# Patient Record
Sex: Female | Born: 1985 | Race: Black or African American | Hispanic: No | Marital: Single
Health system: Midwestern US, Community
[De-identification: ages and names within clinical notes are randomized; demographics above are authoritative.]

## PROBLEM LIST (undated history)

## (undated) DIAGNOSIS — F419 Anxiety disorder, unspecified: Secondary | ICD-10-CM

## (undated) DIAGNOSIS — F32A Depression, unspecified: Secondary | ICD-10-CM

## (undated) DIAGNOSIS — G4730 Sleep apnea, unspecified: Secondary | ICD-10-CM

## (undated) DIAGNOSIS — D649 Anemia, unspecified: Secondary | ICD-10-CM

## (undated) DIAGNOSIS — T7840XA Allergy, unspecified, initial encounter: Secondary | ICD-10-CM

## (undated) DIAGNOSIS — G4733 Obstructive sleep apnea (adult) (pediatric): Secondary | ICD-10-CM

## (undated) DIAGNOSIS — J45909 Unspecified asthma, uncomplicated: Secondary | ICD-10-CM

## (undated) DIAGNOSIS — R011 Cardiac murmur, unspecified: Secondary | ICD-10-CM

## (undated) DIAGNOSIS — R87629 Unspecified abnormal cytological findings in specimens from vagina: Secondary | ICD-10-CM

## (undated) DIAGNOSIS — M1990 Unspecified osteoarthritis, unspecified site: Secondary | ICD-10-CM

## (undated) HISTORY — DX: Anemia, unspecified: D64.9

## (undated) HISTORY — DX: Unspecified abnormal cytological findings in specimens from vagina: R87.629

## (undated) HISTORY — DX: Anxiety disorder, unspecified: F41.9

## (undated) HISTORY — DX: Unspecified osteoarthritis, unspecified site: M19.90

## (undated) HISTORY — PX: ADENOIDECTOMY: SUR15

## (undated) HISTORY — DX: Cardiac murmur, unspecified: R01.1

## (undated) HISTORY — PX: TONSILLECTOMY: SUR1361

## (undated) HISTORY — DX: Depression, unspecified: F32.A

## (undated) HISTORY — DX: Sleep apnea, unspecified: G47.30

## (undated) HISTORY — PX: CHOLECYSTECTOMY: SHX55

## (undated) HISTORY — DX: Allergy, unspecified, initial encounter: T78.40XA

## (undated) HISTORY — PX: POLYPECTOMY: SHX149

---

## 2015-12-10 DIAGNOSIS — G43009 Migraine without aura, not intractable, without status migrainosus: Secondary | ICD-10-CM | POA: Insufficient documentation

## 2015-12-10 DIAGNOSIS — K219 Gastro-esophageal reflux disease without esophagitis: Secondary | ICD-10-CM | POA: Insufficient documentation

## 2016-06-14 DIAGNOSIS — L2084 Intrinsic (allergic) eczema: Secondary | ICD-10-CM | POA: Insufficient documentation

## 2016-12-07 DIAGNOSIS — F419 Anxiety disorder, unspecified: Secondary | ICD-10-CM | POA: Insufficient documentation

## 2017-04-04 DIAGNOSIS — Z6841 Body Mass Index (BMI) 40.0 and over, adult: Secondary | ICD-10-CM | POA: Insufficient documentation

## 2018-02-07 DIAGNOSIS — K635 Polyp of colon: Secondary | ICD-10-CM | POA: Insufficient documentation

## 2020-02-04 DIAGNOSIS — Z5941 Food insecurity: Secondary | ICD-10-CM | POA: Insufficient documentation

## 2020-03-10 ENCOUNTER — Inpatient Hospital Stay
Admit: 2020-03-10 | Discharge: 2020-03-10 | Disposition: A | Discharge status: Home / Self Care | Payer: BLUE CROSS/BLUE SHIELD | Attending: Emergency Medicine

## 2020-03-10 ENCOUNTER — Emergency Department: Admit: 2020-03-10 | Payer: BLUE CROSS/BLUE SHIELD

## 2020-03-10 DIAGNOSIS — U071 COVID-19: Secondary | ICD-10-CM

## 2020-03-10 LAB — METABOLIC PANEL, BASIC
Anion gap: 4 mmol/L — ABNORMAL LOW (ref 5–15)
BUN: 6 mg/dl — ABNORMAL LOW (ref 7–25)
CO2: 30 mEq/L (ref 21–32)
Calcium: 8.9 mg/dl (ref 8.5–10.1)
Chloride: 104 mEq/L (ref 98–107)
Creatinine: 0.6 mg/dl (ref 0.6–1.3)
GFR est AA: >60
GFR est non-AA: >60
Glucose: 81 mg/dl (ref 74–106)
Potassium: 4 mEq/L (ref 3.5–5.1)
Sodium: 138 mEq/L (ref 136–145)

## 2020-03-10 LAB — CBC WITH AUTOMATED DIFF
BASOPHILS: 0.2 % (ref 0–3)
EOSINOPHILS: 0 % (ref 0–5)
HCT: 35.9 % — ABNORMAL LOW (ref 37.0–50.0)
HGB: 11.9 gm/dl — ABNORMAL LOW (ref 13.0–17.2)
IMMATURE GRANULOCYTES: 0.2 % (ref 0.0–3.0)
LYMPHOCYTES: 29.9 % (ref 28–48)
MCH: 29.2 pg (ref 25.4–34.6)
MCHC: 33.1 gm/dl (ref 30.0–36.0)
MCV: 88 fL (ref 80.0–98.0)
MONOCYTES: 9.7 % (ref 1–13)
MPV: 10.3 fL — ABNORMAL HIGH (ref 6.0–10.0)
NEUTROPHILS: 60 % (ref 34–64)
NRBC: 0 (ref 0–0)
PLATELET: 257 10*3/uL (ref 140–450)
RBC: 4.08 M/uL (ref 3.60–5.20)
RDW-SD: 45.1 (ref 36.4–46.3)
WBC: 4.3 10*3/uL (ref 4.0–11.0)

## 2020-03-10 LAB — EKG, 12 LEAD, INITIAL
Atrial Rate: 74 {beats}/min
Calculated P Axis: -11 degrees
Calculated R Axis: 39 degrees
Calculated T Axis: 17 degrees
Diagnosis: NORMAL
P-R Interval: 152 ms
Q-T Interval: 400 ms
QRS Duration: 74 ms
QTC Calculation (Bezet): 444 ms
Ventricular Rate: 74 {beats}/min

## 2020-03-10 LAB — TROPONIN I: Troponin-I: <0.015 ng/ml (ref 0.000–0.045)

## 2020-03-10 LAB — POC HCG,URINE
HCG urine, QL: NEGATIVE
Pregnancy Test(Urn): NEGATIVE

## 2020-03-10 LAB — EKG 12-LEAD
Atrial Rate: 74 {beats}/min
Diagnosis: NORMAL
P Axis: -11 degrees
P-R Interval: 152 ms
Q-T Interval: 400 ms
QRS Duration: 74 ms
QTc Calculation (Bazett): 444 ms
R Axis: 39 degrees
T Axis: 17 degrees
Ventricular Rate: 74 {beats}/min

## 2020-03-10 LAB — BASIC METABOLIC PANEL
Anion Gap: 4 mmol/L — ABNORMAL LOW (ref 5–15)
BUN: 6 mg/dl — ABNORMAL LOW (ref 7–25)
CO2: 30 mEq/L (ref 21–32)
Calcium: 8.9 mg/dl (ref 8.5–10.1)
Chloride: 104 mEq/L (ref 98–107)
Creatinine: 0.6 mg/dl (ref 0.6–1.3)
EGFR IF NonAfrican American: >60
GFR African American: >60
Glucose: 81 mg/dl (ref 74–106)
Potassium: 4 mEq/L (ref 3.5–5.1)
Sodium: 138 mEq/L (ref 136–145)

## 2020-03-10 LAB — CBC WITH AUTO DIFFERENTIAL
Basophils %: 0.2 % (ref 0–3)
Eosinophils %: 0 % (ref 0–5)
Hematocrit: 35.9 % — ABNORMAL LOW (ref 37.0–50.0)
Hemoglobin: 11.9 gm/dl — ABNORMAL LOW (ref 13.0–17.2)
Immature Granulocytes: 0.2 % (ref 0.0–3.0)
Lymphocytes %: 29.9 % (ref 28–48)
MCH: 29.2 pg (ref 25.4–34.6)
MCHC: 33.1 gm/dl (ref 30.0–36.0)
MCV: 88 fL (ref 80.0–98.0)
MPV: 10.3 fL — ABNORMAL HIGH (ref 6.0–10.0)
Monocytes %: 9.7 % (ref 1–13)
Neutrophils %: 60 % (ref 34–64)
Nucleated RBCs: 0 (ref 0–0)
Platelets: 257 10*3/uL (ref 140–450)
RBC: 4.08 M/uL (ref 3.60–5.20)
RDW-SD: 45.1 (ref 36.4–46.3)
WBC: 4.3 10*3/uL (ref 4.0–11.0)

## 2020-03-10 LAB — TROPONIN: Troponin I: <0.015 ng/ml (ref 0.000–0.045)

## 2020-03-10 MED ORDER — IPRATROPIUM-ALBUTEROL 2.5 MG-0.5 MG/3 ML NEB SOLUTION
2.5 mg-0.5 mg/3 ml | RESPIRATORY_TRACT | Status: AC
Start: 2020-03-10 — End: 2020-03-10
  Administered 2020-03-10: 16:00:00 via RESPIRATORY_TRACT

## 2020-03-10 MED ORDER — IOPAMIDOL 61 % IV SOLN
61 % | Freq: Once | INTRAVENOUS | Status: AC
Start: 2020-03-10 — End: 2020-03-10
  Administered 2020-03-10: 19:00:00 via INTRAVENOUS

## 2020-03-10 MED ORDER — BENZONATATE 100 MG CAP
100 mg | ORAL_CAPSULE | Freq: Three times a day (TID) | ORAL | 0 refills | Status: AC | PRN
Start: 2020-03-10 — End: 2020-03-17

## 2020-03-10 MED ORDER — ALBUTEROL SULFATE HFA 90 MCG/ACTUATION AEROSOL INHALER
90 mcg/actuation | RESPIRATORY_TRACT | 0 refills | Status: AC | PRN
Start: 2020-03-10 — End: ?

## 2020-03-10 MED FILL — IPRATROPIUM-ALBUTEROL 2.5 MG-0.5 MG/3 ML NEB SOLUTION: 2.5 mg-0.5 mg/3 ml | RESPIRATORY_TRACT | Qty: 3

## 2020-03-10 MED FILL — ISOVUE-300  61 % INTRAVENOUS SOLUTION: 300 mg iodine /mL (61 %) | INTRAVENOUS | Qty: 80

## 2020-03-10 NOTE — ED Notes (Signed)
Pt comes by ems for chest pain and coughing up bloody sputum- dx with covid 2 days ago at [facility]  Ems gave aspiring in placed 318 in rac

## 2020-03-10 NOTE — ED Provider Notes (Signed)
ED Provider Notes by Everardo AllAbcede, Amalia Edgecombe R, PA at 03/10/20 1015                Author: Everardo AllAbcede, Rylin Saez R, PA  Service: EMERGENCY  Author Type: Physician Assistant       Filed: 03/10/20 1538  Date of Service: 03/10/20 1015  Status: Attested           Editor: Everardo AllAbcede, Daralyn Bert R, PA (Physician Assistant)  Cosigner: Posey ProntoStambaugh, Robert E, MD at 03/10/20 1544          Attestation signed by Posey ProntoStambaugh, Robert E, MD at 03/10/20 1544          I interviewed and examined the patient. I discussed with the mid-level provider agree with her evaluation and plan as documented here.I have reviewed and agree  with all pertinent clinical information including history, physical exam, labs, radiographic studies and the plan.  I have also reviewed and agree with the medications, allergies and past medical history sections for this patient.      My examination, patient alert and nontoxic-appearing.  CT angiogram negative for pulmonary embolism.  Patient maintained good pulse oximetry, so I believe safe for discharge and watchful waiting.                                    [name]  Age: 34 y.o.  Sex: female          Date of Birth: 16-Jul-1985  Admit Date: 03/10/2020  PCP: None         MRN: [id number]   CSN: [id number]            Room: ER22/ER22  Time Dictated: 10:15 AM          Attending Physician: Posey ProntoSTAMBAUGH, ROBERT E, MD   Physician Assistant: Asencion IslamAnnalyn Misa Fedorko      Chief Complaint      Chief Complaint       Patient presents with        ?  Positive For Covid-19        ?  Chest Congestion             History of Present Illness     34 y.o. female  with history of asthma who presents to the ED via medic with multiple complaints.  She states "I somehow contracted Covid".  She became symptomatic for Covid last Monday.  [facility] ago.  Patient complaining of constant midsternal  chest pain since Friday.  She states "it just  hurts".  She feels very short of breath.  She has had a productive cough with blood-tinged sputum.  Fevers have resolved.  She is also had a few episodes of nonbloody vomiting and diarrhea daily.       She is not vaccinated for Covid        Review of Systems     Constitutional: No fever or chills   Eyes: No visual symptoms   ENT: No sore throat, runny nose, or other URI symptoms   Respiratory: As noted above   Cardiovascular: As noted above   Gastrointestinal: As noted above   Genitourinary: No dysuria or hematuria   Musculoskeletal: No swelling   Integumentary: No rashes   Neurological: No headaches   Psychiatric: No HI or SI  Past Medical/Surgical History          Past Medical History:        Diagnosis  Date         ?  Asthma          History reviewed. No pertinent surgical history.           Social History          Social History          Socioeconomic History         ?  Marital status:  SINGLE              Spouse name:  Not on file         ?  Number of children:  Not on file     ?  Years of education:  Not on file     ?  Highest education level:  Not on file       Tobacco Use         ?  Smoking status:  Current Every Day Smoker     ?  Smokeless tobacco:  Never Used       Substance and Sexual Activity         ?  Alcohol use:  Yes             Comment: socailly         ?  Drug use:  Not Currently          Social Determinants of Health          Financial Resource Strain:         ?  Difficulty of Paying Living Expenses:        Food Insecurity:         ?  Worried About Programme researcher, broadcasting/film/video in the Last Year:      ?  Barista in the Last Year:        Transportation Needs:         ?  Freight forwarder (Medical):      ?  Lack of Transportation (Non-Medical):        Physical Activity:         ?  Days of Exercise per Week:      ?  Minutes of Exercise per Session:        Stress:         ?  Feeling of Stress :        Social Connections:         ?  Frequency of Communication with Friends and Family:      ?   Frequency of Social Gatherings with Friends and Family:      ?  Attends Religious Services:      ?  Active Member of Clubs or Organizations:      ?  Attends Banker Meetings:         ?  Marital Status:              Family History          Family History       Family history unknown: Yes           Patient states she is not in contact with any family and therefore do not know any history        Home Medications          None  Allergies          Allergies        Allergen  Reactions         ?  Shellfish Derived  Unknown (comments)             Physical Exam        Visit Vitals      BP  (!) 112/58 (BP 1 Location: Right upper arm)     Pulse  69     Temp  100 ??F (37.8 ??C)     Resp  19        SpO2  98%        General appearance: Well developed, well nourished female  sitting up comfortably in bed on her cell phone   Eyes: Conjunctivae clear, non-icteric   ENT: Ears/Nose: Hearing is grossly intact to voice. External examinations of the ears and nose are unremarkable.   Respiratory: Clear to auscultation bilaterally, unlabored breathing, speaking full sentences   Cardiovascular: Regular rate and rhythm   GI: Abdomen is soft, non-tender   Musculoskeletal: Patient able to move all four extremities. No peripheral edema or posterior calf tenderness bilaterally   Skin: Warm and dry without rashes   Neurologic: Alert, oriented. Answers questions appropriately        Impression and Management Plan     This is a 34 y.o.  female with history of asthma, recently diagnosed with Covid who presents to the ED complaining of constant chest pain and shortness of breath.  Acute ischemic coronary disease must be considered first,  and the patient protected against consequences of same, while other etiologies (including infectious, pulmonary, GI and musculoskeletal)  are considered.  Chest pain protocol initiated prior to my evaluation.  Patient also reports blood-tinged sputum.   Obtain chest CTA to evaluate for acute  PE.        Diagnostic Studies     Lab:      Labs Reviewed       CBC WITH AUTOMATED DIFF - Abnormal; Notable for the following components:            Result  Value            HGB  11.9 (*)         HCT  35.9 (*)         MPV  10.3 (*)            All other components within normal limits       METABOLIC PANEL, BASIC - Abnormal; Notable for the following components:            BUN  6 (*)         Anion gap  4 (*)            All other components within normal limits       TROPONIN I       POC HCG,URINE           Pre-hospital EKG: interpreted by [facility], Jonelle Sports, MD shows sinus rhythm, rate 78, no evidence of acute ischemia      EKG: interpreted by [facility], Jonelle Sports, MD shows normal sinus rhythm, rate 74, intervals within normal limits, no evidence of acute ischemia      Imaging:        Based on my interpretation the chest x-ray shows mild vascular congestion      XR CHEST SNGL V      Result Date: 03/10/2020  INDICATION: Hemoptysis, chest pain. Chest pain COMPARISON: none TECHNIQUE: Single view chest.       IMPRESSION: Heart size is normal. Mild vascular congestion. Patchy infiltrates in the left mid to upper lung zone. Follow-up radiograph to document resolution is recommended.       CT CHEST W CON PE PROTOCOL      Result Date: 03/10/2020   HISTORY: chest pain, covid+ COMPARISON: None TECHNIQUE: Initial unenhanced monitoring slices were obtained through the pulmonary arteries for bolus tracking purposes. Then, multidetector CT acquisition was obtained through the pulmonary arteries and the  chest following IV contrast administration. 3D angiographic post-processing was performed including coronal and sagittal MIP reconstructions. All CT exams at this facility use one or more dose reduction techniques including automatic exposure control,  mA/kV adjustment per patient's size, or iterative reconstruction technique. FINDINGS: Quality of exam: Adequate. Pulmonary arteries: Normal in caliber and enhancement. No filling  defects are identified to suggest pulmonary thromboembolism. Aorta: Normal  in caliber with no aneurysm or dissection. Lungs: Upper lobe patchy consolidation. Additional scattered mild peripheral lung infiltrates Mediastinum: No enlarged nodes considered pathologic by size criteria. Osseous:  No discrete lesion. Miscellaneous:  None.       IMPRESSION: 1. No pulmonary artery filling defects to suggest acute pulmonary thromboembolism. 2. Lung infiltrates consistent Covid 19 pneumonia. Recommend follow-up chest radiograph in one month to ensure resolution         ED Course          ED Course as of Mar 10 1536       Mon Mar 10, 2020        1225  WBC: 4.3 [AA]     1225  HGB(!): 11.9 [AA]     1225  HCT(!): 35.9 [AA]     1225  BUN(!): 6 [AA]     1225  Creatinine: 0.6 [AA]     1225  Troponin-I: <0.015 [AA]     1355  HCG urine, QL: negative [AA]     1355  Spoke to [place] in [place] about patient's negative pregnancy test.  They're going to send for her now     [AA]     1434  Patient pressing on her call bell. Updated patient on the plan.  Awaiting CT scan. Patient upset due to the wait     [AA]              ED Course User Index   [AA] Jeriah Skufca R, PA           Patient stable in the ER.  CBC with no leukocytosis and mild anemia.  BMP unremarkable.  EKG and initial troponin unremarkable for ACS. Urine pregnancy negative.  Chest CT unremarkable for PE.  Related to the patient in the hallway while she was leaving  for CT.  Patient upset and "are not talking to you anymore".  Patient given instructions to continue to self quarantine.  Take medication as prescribed.  Discussed symptomatic treatment for pain and fever. Return to the ER if condition worsens or new  symptoms develop.  Patient agreed to the plan and all of her questions were answered        Medications       albuterol-ipratropium (DUO-NEB) 2.5 MG-0.5 MG/3 ML (3 mL Nebulization Given 03/10/20 1137)       iopamidoL (ISOVUE 300) 61 % contrast injection 80 mL (80 mL  IntraVENous Given 03/10/20 1506)          Final Diagnosis  ICD-10-CM  ICD-9-CM          1.  COVID-19 virus infection   U07.1  079.89     2.  Coughing up blood   R04.2  786.30          3.  SOB (shortness of breath)   R06.02  786.05          Disposition     Patient discharged home in stable condition with the instructions stated above. Prescription for albuterol inhaler and Tessalon Perles given.  Work note issued.            The patient was personally evaluated by myself and discussed with [facility], Jonelle Sports, MD who agrees with the above assessment and plan         Asencion Islam, PA-C   March 10, 2020         [facility] medical dictation software was used for portions of this report. Unintended errors may occur.          My signature above authenticates this document and my orders, the final     diagnosis (es), discharge prescription (s), and instructions in the Epic     record.   If you have any questions please contact [phone].       Nursing notes have been reviewed by the physician/ advanced practice     Clinician.

## 2021-10-15 ENCOUNTER — Other Ambulatory Visit: Payer: Self-pay

## 2021-10-15 ENCOUNTER — Emergency Department (HOSPITAL_COMMUNITY)
Admission: EM | Admit: 2021-10-15 | Discharge: 2021-10-15 | Disposition: A | Discharge status: Home / Self Care | Payer: Medicaid Other | Attending: Emergency Medicine | Admitting: Emergency Medicine

## 2021-10-15 ENCOUNTER — Encounter (HOSPITAL_COMMUNITY): Payer: Self-pay

## 2021-10-15 DIAGNOSIS — J45909 Unspecified asthma, uncomplicated: Secondary | ICD-10-CM | POA: Diagnosis not present

## 2021-10-15 DIAGNOSIS — Z20822 Contact with and (suspected) exposure to covid-19: Secondary | ICD-10-CM | POA: Diagnosis not present

## 2021-10-15 DIAGNOSIS — J069 Acute upper respiratory infection, unspecified: Secondary | ICD-10-CM

## 2021-10-15 DIAGNOSIS — J029 Acute pharyngitis, unspecified: Secondary | ICD-10-CM | POA: Diagnosis present

## 2021-10-15 DIAGNOSIS — J209 Acute bronchitis, unspecified: Secondary | ICD-10-CM | POA: Diagnosis not present

## 2021-10-15 DIAGNOSIS — J40 Bronchitis, not specified as acute or chronic: Secondary | ICD-10-CM

## 2021-10-15 HISTORY — DX: Unspecified asthma, uncomplicated: J45.909

## 2021-10-15 HISTORY — DX: Obstructive sleep apnea (adult) (pediatric): G47.33

## 2021-10-15 LAB — RESP PANEL BY RT-PCR (FLU A&B, COVID) ARPGX2
Influenza A by PCR: NEGATIVE
Influenza B by PCR: NEGATIVE
SARS Coronavirus 2 by RT PCR: NEGATIVE

## 2021-10-15 MED ORDER — METHYLPREDNISOLONE 4 MG PO TBPK: ORAL_TABLET | ORAL | 0 refills | Status: DC

## 2021-10-15 NOTE — ED Notes (Signed)
I provided reinforced discharge education based off of discharge instructions. Pt acknowledged and understood my education. Pt had no further questions/concerns for provider/myself.  °

## 2021-10-15 NOTE — ED Provider Notes (Signed)
?[facility]-EMERGENCY DEPT ?Provider Note ? ? ?CSN: [id number] ?Arrival date & time: 10/15/21  0844 ? ?  ? ?History ? ?Chief Complaint  ?Patient presents with  ? Cough  ? Sore Throat  ? Asthma  ? ? ?Tina Malone is a 36 y.o. female who presents emergency department with chief complaint of URI symptoms.  She has a past medical history of asthma with hospitalization without intubation.  She is on multiple asthma controller medications.  She developed cough, wheezing, sore throat, generalized malaise, runny nose beginning 3 days ago along with both of her children.  She denies fevers or chills and denies any active shortness of breath. ? ? ?Cough ?Sore Throat ? ?Asthma ? ? ?  ? ?Home Medications ?Prior to Admission medications   ?Medication Sig Start Date End Date Taking? Authorizing Provider  ?methylPREDNISolone (MEDROL DOSEPAK) 4 MG TBPK tablet Use as directed 10/15/21   Arthor Captain, PA-C  ?   ? ?Allergies    ?Other and Shellfish allergy   ? ?Review of Systems   ?Review of Systems  ?Respiratory:  Positive for cough.   ? ?Physical Exam ?Updated Vital Signs ?BP 133/90 (BP Location: Right Arm)   Pulse 80   Temp 97.7 ?F (36.5 ?C) (Oral)   Resp 18   Ht 4\' 11"  (1.499 m)   Wt 109.8 kg   LMP 10/04/2021 (Approximate)   SpO2 100%   BMI 48.88 kg/m?  ?Physical Exam ?Vitals and nursing note reviewed.  ?Constitutional:   ?   General: She is not in acute distress. ?   Appearance: She is well-developed. She is not diaphoretic.  ?HENT:  ?   Head: Normocephalic and atraumatic.  ?   Right Ear: Tympanic membrane and external ear normal.  ?   Left Ear: Tympanic membrane and external ear normal.  ?   Nose: Nose normal.  ?   Mouth/Throat:  ?   Mouth: Mucous membranes are moist.  ?   Pharynx: Uvula midline. No posterior oropharyngeal erythema.  ?   Tonsils: No tonsillar exudate or tonsillar abscesses.  ?Eyes:  ?   General: No scleral icterus. ?   Conjunctiva/sclera: Conjunctivae normal.  ?Cardiovascular:  ?    Rate and Rhythm: Normal rate and regular rhythm.  ?   Heart sounds: Normal heart sounds. No murmur heard. ?  No friction rub. No gallop.  ?Pulmonary:  ?   Effort: Pulmonary effort is normal. No respiratory distress.  ?   Breath sounds: Normal breath sounds.  ?Abdominal:  ?   General: Bowel sounds are normal. There is no distension.  ?   Palpations: Abdomen is soft. There is no mass.  ?   Tenderness: There is no abdominal tenderness. There is no guarding.  ?Musculoskeletal:  ?   Cervical back: Normal range of motion.  ?Skin: ?   General: Skin is warm and dry.  ?Neurological:  ?   Mental Status: She is alert and oriented to person, place, and time.  ?Psychiatric:     ?   Behavior: Behavior normal.  ? ? ?ED Results / Procedures / Treatments   ?Labs ?(all labs ordered are listed, but only abnormal results are displayed) ?Labs Reviewed  ?RESP PANEL BY RT-PCR (FLU A&B, COVID) [id number]  ? ? ?EKG ?None ? ?Radiology ?No results found. ? ?Procedures ?Procedures  ? ? ?Medications Ordered in ED ?Medications - No data to display ? ?ED Course/ Medical Decision Making/ A&P ?  ?                        ?  Medical Decision Making ? Patients symptoms are consistent with URI, likely viral etiology. Discussed that antibiotics are not indicated for viral infections. Pt will be discharged with symptomatic treatment.  Verbalizes understanding and is agreeable with plan. Pt is hemodynamically stable & in NAD prior to dc. ? ?D/c with steroids. ? ?Problems Addressed: ?Bronchitis with acute wheezing: complicated acute illness or injury ?Upper respiratory tract infection, unspecified type: complicated acute illness or injury ? ?Amount and/or Complexity of Data Reviewed ?Labs: ordered. ?Radiology: ordered. ? ?Risk ?Prescription drug management. ? ? ? ? ? ?  ? ? ? ? ?Final Clinical Impression(s) / ED Diagnoses ?Final diagnoses:  ?Upper respiratory tract infection, unspecified type  ?Bronchitis with acute wheezing  ? ? ?Rx / DC Orders ?ED Discharge  Orders   ? ?      Ordered  ?  methylPREDNISolone (MEDROL DOSEPAK) 4 MG TBPK tablet  Status:  Discontinued       ? 10/15/21 0943  ?  methylPREDNISolone (MEDROL DOSEPAK) 4 MG TBPK tablet       ? 10/15/21 0957  ? ?  ?  ? ?  ? ? ?  ?Arthor Captain, PA-C ?10/15/21 1321 ? ?  ?Lorre Nick, MD ?10/16/21 [phone] ? ?

## 2021-10-15 NOTE — Discharge Instructions (Signed)
Get help right away if: You have shortness of breath that gets worse. You have severe or persistent: Headache. Ear pain. Sinus pain. Chest pain. You have chronic lung disease along with any of the following: Making high-pitched whistling sounds when you breathe, most often when you breathe out (wheezing). Prolonged cough (more than 14 days). Coughing up blood. A change in your usual mucus. You have a stiff neck. You have changes in your: Vision. Hearing. Thinking. Mood. 

## 2021-10-15 NOTE — ED Triage Notes (Signed)
Patient c/o cough, sore throat and asthma x 3 days ?

## 2021-11-17 ENCOUNTER — Ambulatory Visit
Admission: EM | Admit: 2021-11-17 | Discharge: 2021-11-17 | Disposition: A | Discharge status: Home / Self Care | Payer: Medicaid Other | Attending: Urgent Care | Admitting: Urgent Care

## 2021-11-17 ENCOUNTER — Ambulatory Visit (HOSPITAL_BASED_OUTPATIENT_CLINIC_OR_DEPARTMENT_OTHER)
Admission: RE | Admit: 2021-11-17 | Discharge: 2021-11-17 | Disposition: A | Discharge status: Home / Self Care | Payer: Medicaid Other | Source: Ambulatory Visit | Attending: Urgent Care | Admitting: Urgent Care

## 2021-11-17 ENCOUNTER — Telehealth: Payer: Self-pay

## 2021-11-17 DIAGNOSIS — M5416 Radiculopathy, lumbar region: Secondary | ICD-10-CM

## 2021-11-17 DIAGNOSIS — M5136 Other intervertebral disc degeneration, lumbar region: Secondary | ICD-10-CM | POA: Diagnosis not present

## 2021-11-17 DIAGNOSIS — M545 Low back pain, unspecified: Secondary | ICD-10-CM | POA: Insufficient documentation

## 2021-11-17 MED ORDER — TIZANIDINE HCL 4 MG PO TABS: 4.0000 mg | ORAL_TABLET | Freq: Every day | ORAL | 0 refills | Status: AC

## 2021-11-17 MED ORDER — PREDNISONE 50 MG PO TABS: 50.0000 mg | ORAL_TABLET | Freq: Every day | ORAL | 0 refills | Status: DC

## 2021-11-17 NOTE — Telephone Encounter (Signed)
Patient verification complete (name and date of birth).   Patient re-educated on xray results. All questions answered from patient.

## 2021-11-17 NOTE — ED Provider Notes (Signed)
[facility]   MRN: [id number] DOB: 07-30-1985  Subjective:   Tina Malone is a 36 y.o. female presenting for 1 week history of persistent right-sided low back pain that radiates into the buttock, back of the leg all the way down to her foot.  Has had some tingling and uncomfortable sensation as well.  No changes to bowel or urinary habits.  No fall, trauma, weakness, saddle paresthesia, hematuria.  Has been using Tylenol, Motrin, NSAIDs.  No current facility-administered medications for this encounter.  Current Outpatient Medications:    methylPREDNISolone (MEDROL DOSEPAK) 4 MG TBPK tablet, Use as directed, Disp: 21 tablet, Rfl: 0   Allergies  Allergen Reactions   Other     Oak trees, dogs, and cats   Shellfish Allergy     Past Medical History:  Diagnosis Date   Asthma    OSA (obstructive sleep apnea)      Past Surgical History:  Procedure Laterality Date   ADENOIDECTOMY     CHOLECYSTECTOMY     POLYPECTOMY     TONSILLECTOMY      History reviewed. No pertinent family history.  Social History   Tobacco Use   Smoking status: Some Days    Types: Cigars, Cigarettes   Smokeless tobacco: Never  Vaping Use   Vaping Use: Never used  Substance Use Topics   Alcohol use: Yes   Drug use: Never    ROS   Objective:   Vitals: BP 133/87 (BP Location: Right Arm)   Pulse 67   Temp 97.9 F (36.6 C) (Oral)   Resp 20   LMP 10/24/2021   SpO2 98%   Physical Exam Constitutional:      General: She is not in acute distress.    Appearance: Normal appearance. She is well-developed. She is not ill-appearing, toxic-appearing or diaphoretic.  HENT:     Head: Normocephalic and atraumatic.     Nose: Nose normal.     Mouth/Throat:     Mouth: Mucous membranes are moist.  Eyes:     General: No scleral icterus.       Right eye: No discharge.        Left eye: No discharge.     Extraocular Movements: Extraocular movements intact.  Cardiovascular:      Rate and Rhythm: Normal rate.  Pulmonary:     Effort: Pulmonary effort is normal.  Musculoskeletal:     Lumbar back: Tenderness (over area outlined) present. No swelling, edema, deformity, signs of trauma, lacerations, spasms or bony tenderness. Normal range of motion. Positive right straight leg raise test. Negative left straight leg raise test. No scoliosis.       Back:  Skin:    General: Skin is warm and dry.  Neurological:     General: No focal deficit present.     Mental Status: She is alert and oriented to person, place, and time.     Motor: No weakness.     Coordination: Coordination normal.     Gait: Gait normal.     Deep Tendon Reflexes: Reflexes normal.  Psychiatric:        Mood and Affect: Mood normal.        Behavior: Behavior normal.        Thought Content: Thought content normal.        Judgment: Judgment normal.   Assessment and Plan :   PDMP not reviewed this encounter.  1. Lumbar radiculopathy    Suspect lumbar radiculopathy and as she  has failed NSAID treatment will be using an oral prednisone course with a muscle relaxant.  Patient would like to have an x-ray done and I will pursue this as an outpatient.  Recommended follow-up with the neuro spine specialist should her symptoms persist.  Counseled patient on potential for adverse effects with medications prescribed/recommended today, ER and return-to-clinic precautions discussed, patient verbalized understanding.  We will follow-up with the radiology over read and any changes to treatment plan.   Wallis Bamberg, PA-C 11/17/21 1244

## 2021-11-17 NOTE — ED Triage Notes (Signed)
Pt c/o back pain that radiates down her right leg to her foot. Started: over a week ago Home interventions: tylenol, bayer, motrin

## 2021-12-03 ENCOUNTER — Encounter: Payer: Self-pay | Admitting: Obstetrics and Gynecology

## 2021-12-03 ENCOUNTER — Encounter: Payer: Medicaid Other | Admitting: Obstetrics and Gynecology

## 2022-01-11 ENCOUNTER — Encounter: Payer: Medicaid Other | Admitting: Obstetrics & Gynecology

## 2022-04-27 ENCOUNTER — Encounter (HOSPITAL_BASED_OUTPATIENT_CLINIC_OR_DEPARTMENT_OTHER): Payer: Self-pay

## 2022-04-27 ENCOUNTER — Other Ambulatory Visit: Payer: Self-pay

## 2022-04-27 ENCOUNTER — Emergency Department (HOSPITAL_BASED_OUTPATIENT_CLINIC_OR_DEPARTMENT_OTHER)
Admission: EM | Admit: 2022-04-27 | Discharge: 2022-04-27 | Disposition: A | Discharge status: Home / Self Care | Payer: Medicaid Other | Attending: Emergency Medicine | Admitting: Emergency Medicine

## 2022-04-27 DIAGNOSIS — J45909 Unspecified asthma, uncomplicated: Secondary | ICD-10-CM | POA: Diagnosis not present

## 2022-04-27 DIAGNOSIS — B359 Dermatophytosis, unspecified: Secondary | ICD-10-CM | POA: Diagnosis not present

## 2022-04-27 DIAGNOSIS — X58XXXA Exposure to other specified factors, initial encounter: Secondary | ICD-10-CM | POA: Diagnosis not present

## 2022-04-27 DIAGNOSIS — L089 Local infection of the skin and subcutaneous tissue, unspecified: Secondary | ICD-10-CM

## 2022-04-27 DIAGNOSIS — S90822A Blister (nonthermal), left foot, initial encounter: Secondary | ICD-10-CM | POA: Diagnosis not present

## 2022-04-27 DIAGNOSIS — L301 Dyshidrosis [pompholyx]: Secondary | ICD-10-CM | POA: Insufficient documentation

## 2022-04-27 DIAGNOSIS — S90821A Blister (nonthermal), right foot, initial encounter: Secondary | ICD-10-CM | POA: Diagnosis present

## 2022-04-27 MED ORDER — CEPHALEXIN 500 MG PO CAPS: ORAL_CAPSULE | ORAL | 0 refills | Status: AC

## 2022-04-27 MED ORDER — KETOCONAZOLE 2 % EX CREA: 1.0000 | TOPICAL_CREAM | Freq: Every day | CUTANEOUS | 0 refills | Status: AC

## 2022-04-27 MED ORDER — MUPIROCIN 2 % EX OINT: 1.0000 | TOPICAL_OINTMENT | Freq: Every day | CUTANEOUS | 0 refills | Status: AC

## 2022-04-27 NOTE — ED Triage Notes (Signed)
Pt states she has blisters on both feet. States blisters have been on feet for over a week and has not treated them. Also states she has on right hand.

## 2022-04-27 NOTE — Discharge Instructions (Signed)
Contact a health care provider if: You have symptoms that do not go away. You have signs of infection, such as: Crusting, pus, or a bad smell. More redness, swelling, or pain. Increased warmth in the affected area. Get help right away if: Your skin gets streaking redness with associated pain.

## 2022-04-27 NOTE — ED Provider Notes (Signed)
[facility] EMERGENCY DEPT Provider Note   CSN: [id number] Arrival date & time: 04/27/22  1254     History  No chief complaint on file.   Tina Malone is a 36 y.o. female with a long standing history of allergies, generalized eczema and and asthma.  Patient states that since she got pregnant 7 years ago he has been getting itching, blisters on her feet.  They are both itchy and painful.  She is also had a blister in between her right fourth and fifth digits which exploded a week ago.  Since that time she has had increasing pain in the area.  She is concerned she may have a secondary infection.  HPI     Home Medications Prior to Admission medications   Medication Sig Start Date End Date Taking? Authorizing Provider  methylPREDNISolone (MEDROL DOSEPAK) 4 MG TBPK tablet Use as directed 10/15/21   Arthor Captain, PA-C  predniSONE (DELTASONE) 50 MG tablet Take 1 tablet (50 mg total) by mouth daily with breakfast. 11/17/21   Wallis Bamberg, PA-C  tiZANidine (ZANAFLEX) 4 MG tablet Take 1 tablet (4 mg total) by mouth at bedtime. 11/17/21   Wallis Bamberg, PA-C      Allergies    Other and Shellfish allergy    Review of Systems   Review of Systems  Physical Exam Updated Vital Signs BP 128/82 (BP Location: Right Arm)   Pulse 65   Temp 98.4 F (36.9 C) (Oral)   Resp 20   Ht 4\' 11"  (1.499 m)   Wt 105.2 kg   SpO2 99%   BMI 46.86 kg/m  Physical Exam Vitals and nursing note reviewed.  Constitutional:      General: She is not in acute distress.    Appearance: She is well-developed. She is not diaphoretic.  HENT:     Head: Normocephalic and atraumatic.     Right Ear: External ear normal.     Left Ear: External ear normal.     Nose: Nose normal.     Mouth/Throat:     Mouth: Mucous membranes are moist.  Eyes:     General: No scleral icterus.    Conjunctiva/sclera: Conjunctivae normal.  Cardiovascular:     Rate and Rhythm: Normal rate and regular rhythm.     Heart  sounds: Normal heart sounds. No murmur heard.    No friction rub. No gallop.  Pulmonary:     Effort: Pulmonary effort is normal. No respiratory distress.     Breath sounds: Normal breath sounds.  Abdominal:     General: Bowel sounds are normal. There is no distension.     Palpations: Abdomen is soft. There is no mass.     Tenderness: There is no abdominal tenderness. There is no guarding.  Musculoskeletal:     Cervical back: Normal range of motion.  Skin:    General: Skin is warm and dry.     Comments: Dry skin over bilateral feet, macerated tissue between the fourth and fifth digits on the right side there is a surrounding tissue with peeling and scaling.  Diffuse small blisters under the thick callus.  Neurological:     Mental Status: She is alert and oriented to person, place, and time.  Psychiatric:        Behavior: Behavior normal.     ED Results / Procedures / Treatments   Labs (all labs ordered are listed, but only abnormal results are displayed) Labs Reviewed - No data to display  EKG None  Radiology No results found.  Procedures Procedures    Medications Ordered in ED Medications - No data to display  ED Course/ Medical Decision Making/ A&P                           Medical Decision Making Patient with dyshidrotic dermatitis and blistering under the calluses of her feet.  She appears to have a secondary fungal and bacterial infection.  We will treat with Keflex, ketoconazole, and Bactroban ointment.  She was given referrals to dermatology, allergy and asthma, podiatry.  Patient is new to the area.  She is advised to follow-up with her PCP.  No evidence of cellulitis.  Risk Prescription drug management.     ICD-10-CM   1. Dyshidrotic foot dermatitis  L30.1     2. Tinea  B35.9     3. Skin infection  L08.9             Final Clinical Impression(s) / ED Diagnoses Final diagnoses:  Dyshidrotic foot dermatitis  Tinea  Skin infection    Rx / DC  Orders ED Discharge Orders     None         Margarita Mail, PA-C 04/27/22 1749    Ezequiel Essex, MD 04/27/22 1955

## 2022-05-06 ENCOUNTER — Ambulatory Visit: Payer: Medicaid Other | Admitting: Podiatry

## 2022-05-06 DIAGNOSIS — R238 Other skin changes: Secondary | ICD-10-CM | POA: Diagnosis not present

## 2022-05-06 MED ORDER — DOXYCYCLINE HYCLATE 100 MG PO TABS: 100.0000 mg | ORAL_TABLET | Freq: Two times a day (BID) | ORAL | 0 refills | Status: AC

## 2022-05-06 MED ORDER — TERBINAFINE HCL 250 MG PO TABS: 250.0000 mg | ORAL_TABLET | Freq: Every day | ORAL | 0 refills | Status: AC

## 2022-05-06 MED ORDER — METHYLPREDNISOLONE 4 MG PO TBPK: ORAL_TABLET | ORAL | 0 refills | Status: DC

## 2022-05-06 MED ORDER — PREGABALIN 75 MG PO CAPS: 75.0000 mg | ORAL_CAPSULE | Freq: Two times a day (BID) | ORAL | 0 refills | Status: AC

## 2022-05-06 NOTE — Progress Notes (Signed)
Subjective:  Patient ID: Tina Malone, female    DOB: 01-22-1986,  MRN: [id number]  Chief Complaint  Patient presents with   blisters     Pt stated that she gets blisters     36 y.o. female presents with the above complaint.  Patient presents with complaint of bilateral superficial blister small across both foot.  Patient states it came out of nowhere and has progressive gotten worse.  She states that they come and go as it pleases to get healed up but wanted to get it evaluated she has not seen anyone else prior to seeing me.  She is currently not taking any medication by mouth.  She went to her dermatologist who gave her a topical cream which did not help much.   Review of Systems: Negative except as noted in the HPI. Denies N/V/F/Ch.  Past Medical History:  Diagnosis Date   Asthma    OSA (obstructive sleep apnea)     Current Outpatient Medications:    doxycycline (VIBRA-TABS) 100 MG tablet, Take 1 tablet (100 mg total) by mouth 2 (two) times daily., Disp: 28 tablet, Rfl: 0   methylPREDNISolone (MEDROL DOSEPAK) 4 MG TBPK tablet, Take as directed, Disp: 21 each, Rfl: 0   pregabalin (LYRICA) 75 MG capsule, Take 1 capsule (75 mg total) by mouth 2 (two) times daily., Disp: 60 capsule, Rfl: 0   terbinafine (LAMISIL) 250 MG tablet, Take 1 tablet (250 mg total) by mouth daily., Disp: 30 tablet, Rfl: 0   cephALEXin (KEFLEX) 500 MG capsule, 2 caps po bid x 7 days, Disp: 28 capsule, Rfl: 0   ketoconazole (NIZORAL) 2 % cream, Apply 1 Application topically daily., Disp: 30 g, Rfl: 0   methylPREDNISolone (MEDROL DOSEPAK) 4 MG TBPK tablet, Use as directed, Disp: 21 tablet, Rfl: 0   mupirocin ointment (BACTROBAN) 2 %, Apply 1 Application topically daily., Disp: 22 g, Rfl: 0   predniSONE (DELTASONE) 50 MG tablet, Take 1 tablet (50 mg total) by mouth daily with breakfast., Disp: 5 tablet, Rfl: 0   tiZANidine (ZANAFLEX) 4 MG tablet, Take 1 tablet (4 mg total) by mouth at bedtime., Disp: 30 tablet,  Rfl: 0  Social History   Tobacco Use  Smoking Status Some Days   Types: Cigars, Cigarettes  Smokeless Tobacco Never    Allergies  Allergen Reactions   Other     Oak trees, dogs, and cats   Shellfish Allergy    Objective:  There were no vitals filed for this visit. There is no height or weight on file to calculate BMI. Constitutional Well developed. Well nourished.  Vascular Dorsalis pedis pulses palpable bilaterally. Posterior tibial pulses palpable bilaterally. Capillary refill normal to all digits.  No cyanosis or clubbing noted. Pedal hair growth normal.  Neurologic Normal speech. Oriented to person, place, and time. Epicritic sensation to light touch grossly present bilaterally.  Dermatologic Bilateral superficial skin blisters noted no signs of infection noted no purulent drainage noted.  Serosanguineous drainage was drained.  Appears to be more like a dermatological condition.  Orthopedic: Normal joint ROM without pain or crepitus bilaterally. No visible deformities. No bony tenderness.   Radiographs: None Assessment:   1. Blisters of multiple sites    Plan:  Patient was evaluated and treated and all questions answered.  Bilateral foot blister -All questions and concerns were discussed with the patient in extensive detail. -At this time I am unable to appreciate the etiology of the blister formation she is not swollen or have lymphedema  therefore I do not think this is due to venous flow.  This may be more infectious or fungal in nature therefore I will place her on doxycycline and Lamisil.  We will also place on a steroid pack as well.  I discussed the reasoning behind all of this treatment options she states understanding.  If there is no improvement we will discuss biopsy  No follow-ups on file.  Bilateral foot blister Lamisil for 30 days doxycycline for 14 days.  Medrol Dosepak.

## 2022-06-02 ENCOUNTER — Ambulatory Visit: Payer: Medicaid Other | Admitting: Podiatry

## 2023-01-26 ENCOUNTER — Other Ambulatory Visit: Payer: Self-pay

## 2023-01-26 ENCOUNTER — Emergency Department (HOSPITAL_BASED_OUTPATIENT_CLINIC_OR_DEPARTMENT_OTHER)
Admission: EM | Admit: 2023-01-26 | Discharge: 2023-01-26 | Disposition: A | Discharge status: Home / Self Care | Payer: Medicaid Other | Attending: Emergency Medicine | Admitting: Emergency Medicine

## 2023-01-26 ENCOUNTER — Encounter (HOSPITAL_BASED_OUTPATIENT_CLINIC_OR_DEPARTMENT_OTHER): Payer: Self-pay | Admitting: Emergency Medicine

## 2023-01-26 DIAGNOSIS — M545 Low back pain, unspecified: Secondary | ICD-10-CM | POA: Diagnosis present

## 2023-01-26 DIAGNOSIS — M5441 Lumbago with sciatica, right side: Secondary | ICD-10-CM | POA: Diagnosis not present

## 2023-01-26 DIAGNOSIS — J45909 Unspecified asthma, uncomplicated: Secondary | ICD-10-CM | POA: Diagnosis not present

## 2023-01-26 MED ORDER — KETOROLAC TROMETHAMINE 30 MG/ML IJ SOLN
30.0000 mg | Freq: Once | INTRAMUSCULAR | Status: AC
  Administered 2023-01-26: 30 mg via INTRAMUSCULAR
  Filled 2023-01-26: qty 1

## 2023-01-26 MED ORDER — PREDNISONE 10 MG PO TABS: 40.0000 mg | ORAL_TABLET | Freq: Every day | ORAL | 0 refills | Status: AC

## 2023-01-26 MED ORDER — CYCLOBENZAPRINE HCL 10 MG PO TABS: 10.0000 mg | ORAL_TABLET | Freq: Two times a day (BID) | ORAL | 0 refills | Status: AC | PRN

## 2023-01-26 MED ORDER — LIDOCAINE 5 % EX PTCH
1.0000 | MEDICATED_PATCH | CUTANEOUS | Status: DC
  Administered 2023-01-26: 1 via TRANSDERMAL
  Filled 2023-01-26: qty 1

## 2023-01-26 MED ORDER — LIDOCAINE 5 % EX PTCH: 1.0000 | MEDICATED_PATCH | CUTANEOUS | 0 refills | Status: AC

## 2023-01-26 NOTE — ED Provider Notes (Signed)
[place] EMERGENCY DEPARTMENT AT [facility] Provider Note   CSN: [id number] Arrival date & time: 01/26/23  1226     History  Chief Complaint  Patient presents with   Back Pain    Tina Malone is a 37 y.o. female.   Back Pain   37 year old female presents emergency department with complaints of right low back pain with radiation down her right leg to her foot for the past 2 days.  Patient reports history of "bulging disc" as well as intermittent back pain as well as right-sided sciatica and states this feels similar.  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  She states that symptoms exacerbated when she was moving objects in the kitchen 2 days ago.  States has been taking at home Motrin, Excedrin as well as using heat/cooling topical applications which have helped some.  Presents emergency department for further assessment.  Denies fever, saddle anesthesia, bowel/bladder dysfunction, weakness/sensory deficits different from baseline, history of IV drug use, prolonged corticosteroid use/known immunosuppression, known malignancy.  Reports some baseline decree sensation along anterior lower leg from about mid shin to foot that has been present for the past "several years".  Denies any abdominal pain, nausea, vomiting, urinary symptoms, change in bowel habits.  States she was on both prednisone as well as muscle relaxer in May of last year due to similar exacerbation of which helped her symptoms tremendously.  Past medical history significant for asthma, OSA  Home Medications Prior to Admission medications   Medication Sig Start Date End Date Taking? Authorizing Provider  cyclobenzaprine (FLEXERIL) 10 MG tablet Take 1 tablet (10 mg total) by mouth 2 (two) times daily as needed for muscle spasms. 01/26/23  Yes Sherian Maroon A, PA  lidocaine (LIDODERM) 5 % Place 1 patch onto the skin daily. Remove & Discard patch within 12 hours or as  directed by MD 01/26/23  Yes Sherian Maroon A, PA  predniSONE (DELTASONE) 10 MG tablet Take 4 tablets (40 mg total) by mouth daily for 6 days. 01/26/23 02/01/23 Yes Sherian Maroon A, PA  cephALEXin (KEFLEX) 500 MG capsule 2 caps po bid x 7 days 04/27/22   Arthor Captain, PA-C  doxycycline (VIBRA-TABS) 100 MG tablet Take 1 tablet (100 mg total) by mouth 2 (two) times daily. 05/06/22   Candelaria Stagers, DPM  ketoconazole (NIZORAL) 2 % cream Apply 1 Application topically daily. 04/27/22   Arthor Captain, PA-C  mupirocin ointment (BACTROBAN) 2 % Apply 1 Application topically daily. 04/27/22   Harris, Cammy Copa, PA-C  pregabalin (LYRICA) 75 MG capsule Take 1 capsule (75 mg total) by mouth 2 (two) times daily. 05/06/22   Candelaria Stagers, DPM  terbinafine (LAMISIL) 250 MG tablet Take 1 tablet (250 mg total) by mouth daily. 05/06/22   Candelaria Stagers, DPM  tiZANidine (ZANAFLEX) 4 MG tablet Take 1 tablet (4 mg total) by mouth at bedtime. 11/17/21   Wallis Bamberg, PA-C      Allergies    Other and Shellfish allergy    Review of Systems   Review of Systems  Musculoskeletal:  Positive for back pain.  All other systems reviewed and are negative.   Physical Exam Updated Vital Signs BP 131/79 (BP Location: Right Arm)   Pulse 63   Temp 98.1 F (36.7 C)   Resp 18   Ht 4\' 11"  (1.499 m)   Wt 105.2 kg   LMP 01/23/2023 (Approximate)   SpO2 100%   BMI  46.84 kg/m  Physical Exam Vitals and nursing note reviewed.  Constitutional:      General: She is not in acute distress.    Appearance: She is well-developed.  HENT:     Head: Normocephalic and atraumatic.  Eyes:     Conjunctiva/sclera: Conjunctivae normal.  Cardiovascular:     Rate and Rhythm: Normal rate and regular rhythm.     Heart sounds: No murmur heard. Pulmonary:     Effort: Pulmonary effort is normal. No respiratory distress.     Breath sounds: Normal breath sounds.  Abdominal:     Palpations: Abdomen is soft.     Tenderness: There is no  abdominal tenderness.  Musculoskeletal:        General: No swelling.     Cervical back: Neck supple.     Comments: No midline tenderness of cervical, thoracic, lumbar spine with no obvious step-off or deformity noted.  Paraspinal tenderness noted in the right lumbar region.  Muscular strength 5 out of 5 for bilateral ankle dorsi/plantarflexion, knee flexion/extension, hip extension/flexion.  Patellar reflexes symmetric bilaterally.  Pedal pulses 2+ bilaterally.  Straight leg raise positive on the right.  Skin:    General: Skin is warm and dry.     Capillary Refill: Capillary refill takes less than 2 seconds.  Neurological:     Mental Status: She is alert.  Psychiatric:        Mood and Affect: Mood normal.     ED Results / Procedures / Treatments   Labs (all labs ordered are listed, but only abnormal results are displayed) Labs Reviewed - No data to display  EKG None  Radiology No results found.  Procedures Procedures    Medications Ordered in ED Medications  lidocaine (LIDODERM) 5 % 1 patch (1 patch Transdermal Patch Applied 01/26/23 1405)  ketorolac (TORADOL) 30 MG/ML injection 30 mg (30 mg Intramuscular Given 01/26/23 1406)    ED Course/ Medical Decision Making/ A&P                                 Medical Decision Making Risk Prescription drug management.   This patient presents to the ED for concern of back pain, this involves an extensive number of treatment options, and is a complaint that carries with it a high risk of complications and morbidity.  The differential diagnosis includes fracture, strain/sprain, dislocation, ligamentous/tendinous injury, neurovascular compromise, AAA, aortic dissection, osteomyelitis, discitis, bulging disc, cauda equina, spinal epidural abscess   Co morbidities that complicate the patient evaluation  See HPI   Additional history obtained:  Additional history obtained from EMR External records from outside source obtained and  reviewed including hospital records.  Lumbar x-ray performed in 5/23   Lab Tests:  N/a   Imaging Studies ordered:  N/a   Cardiac Monitoring: / EKG:  The patient was maintained on a cardiac monitor.  I personally viewed and interpreted the cardiac monitored which showed an underlying rhythm of: Sinus rhythm   Consultations Obtained:  N/a   Problem List / ED Course / Critical interventions / Medication management  Right low back pain with sciatica I ordered medication including Toradol, Lidoderm   Reevaluation of the patient after these medicines showed that the patient improved I have reviewed the patients home medicines and have made adjustments as needed   Social Determinants of Health:  Denies tobacco, illicit drug use   Test / Admission - Considered:  Right low  back pain with sciatica Vitals signs  within normal range and stable throughout visit. 37 year old female presents emergency department complaints of right low back pain with sciatica after lifting heavy objects in the kitchen repetitively.  On exam, patient with evidence of right paraspinal tenderness of the lumbar region with straight leg raise positive on the right.  No red flag signs for back pain elicited on HPI or appreciable on PE.  Low suspicion for cauda equina/spinal epidural abscess or other spinal cord impingement.  Patient without evidence of pulse deficits, significant hypertension so low suspicion for aortic dissection.  Patient without urinary symptoms, CVA tenderness to low suspicion for pyelonephritis/nephrolithiasis.  I suspect symptoms are likely secondary to right-sided sciatica with muscular strain and right paraspinal lumbar region.  Patient reported significant improvement with administration of prednisone as well as muscle laxer in the past and will begin similar treatment course as well as recommend rehabilitation exercises and follow-up with spinal specialist in the outpatient setting.   Treatment plan discussed at length with patient and she nausea nursing was agreeable to said plan.  Patient well-appearing, febrile no acute distress. Worrisome signs and symptoms were discussed with the patient, and the patient acknowledged understanding to return to the ED if noticed. Patient was stable upon discharge.          Final Clinical Impression(s) / ED Diagnoses Final diagnoses:  Acute right-sided low back pain with right-sided sciatica    Rx / DC Orders ED Discharge Orders          Ordered    predniSONE (DELTASONE) 10 MG tablet  Daily        01/26/23 1359    cyclobenzaprine (FLEXERIL) 10 MG tablet  2 times daily PRN        01/26/23 1359    lidocaine (LIDODERM) 5 %  Every 24 hours        01/26/23 1414              Peter Garter, [place] 01/26/23 1417    Laurence Spates, MD 01/27/23 [phone]

## 2023-01-26 NOTE — ED Triage Notes (Signed)
Pt via pov from home with back pain x 2 days. Pt has hx of sciatica and bulging disc. Pt alert & oriented, nad noted.

## 2023-01-26 NOTE — Discharge Instructions (Addendum)
As discussed, visit emergency department today overall consistent with right-sided sciatica.  See information attached to discharge papers regarding rehab exercises.  Will send in steroids for treatment of your back pain as well as muscle laxer.  Note the muscle laxer can cause drowsiness so please do not drive while taking said medication.  Recommend follow-up with primary care for reassessment of your symptoms.  I will also attach information regarding spinal specialist to follow-up with given your recurring back pain as they can do more directed interventions than oral medication.  Please do not hesitate to return to emergency department if the worrisome signs and symptoms we discussed become apparent.

## 2023-02-02 ENCOUNTER — Ambulatory Visit: Payer: Medicaid Other | Admitting: Podiatry

## 2023-02-02 DIAGNOSIS — L089 Local infection of the skin and subcutaneous tissue, unspecified: Secondary | ICD-10-CM

## 2023-02-02 DIAGNOSIS — B999 Unspecified infectious disease: Secondary | ICD-10-CM

## 2023-02-02 MED ORDER — TERBINAFINE HCL 250 MG PO TABS: 250.0000 mg | ORAL_TABLET | Freq: Every day | ORAL | 0 refills | Status: AC

## 2023-02-02 MED ORDER — DOXYCYCLINE HYCLATE 100 MG PO TABS: 100.0000 mg | ORAL_TABLET | Freq: Two times a day (BID) | ORAL | 0 refills | Status: AC

## 2023-02-02 MED ORDER — CLOTRIMAZOLE-BETAMETHASONE 1-0.05 % EX CREA: 1.0000 | TOPICAL_CREAM | Freq: Every day | CUTANEOUS | 0 refills | Status: AC

## 2023-02-02 NOTE — Progress Notes (Signed)
Subjective:  Patient ID: Tina Malone, female    DOB: Nov 02, 1985,  MRN: [id number]  Chief Complaint  Patient presents with   blisters     37 y.o. female presents with the above complaint.  Patient presents with bilateral severe foot infection with skin epidermolysis with involvement of fungal infection/superinfection.  She states been present for quite some time is progressive gotten worse hurts with ambulation worse with pressure she wanted get it evaluated she has not tried any treatment options for this.   Review of Systems: Negative except as noted in the HPI. Denies N/V/F/Ch.  Past Medical History:  Diagnosis Date   Asthma    OSA (obstructive sleep apnea)     Current Outpatient Medications:    clotrimazole-betamethasone (LOTRISONE) cream, Apply 1 Application topically daily., Disp: 30 g, Rfl: 0   doxycycline (VIBRA-TABS) 100 MG tablet, Take 1 tablet (100 mg total) by mouth 2 (two) times daily., Disp: 20 tablet, Rfl: 0   terbinafine (LAMISIL) 250 MG tablet, Take 1 tablet (250 mg total) by mouth daily., Disp: 90 tablet, Rfl: 0   cephALEXin (KEFLEX) 500 MG capsule, 2 caps po bid x 7 days, Disp: 28 capsule, Rfl: 0   cyclobenzaprine (FLEXERIL) 10 MG tablet, Take 1 tablet (10 mg total) by mouth 2 (two) times daily as needed for muscle spasms., Disp: 20 tablet, Rfl: 0   doxycycline (VIBRA-TABS) 100 MG tablet, Take 1 tablet (100 mg total) by mouth 2 (two) times daily., Disp: 28 tablet, Rfl: 0   ketoconazole (NIZORAL) 2 % cream, Apply 1 Application topically daily., Disp: 30 g, Rfl: 0   lidocaine (LIDODERM) 5 %, Place 1 patch onto the skin daily. Remove & Discard patch within 12 hours or as directed by MD, Disp: 30 patch, Rfl: 0   mupirocin ointment (BACTROBAN) 2 %, Apply 1 Application topically daily., Disp: 22 g, Rfl: 0   pregabalin (LYRICA) 75 MG capsule, Take 1 capsule (75 mg total) by mouth 2 (two) times daily., Disp: 60 capsule, Rfl: 0   terbinafine (LAMISIL) 250 MG tablet, Take 1  tablet (250 mg total) by mouth daily., Disp: 30 tablet, Rfl: 0   tiZANidine (ZANAFLEX) 4 MG tablet, Take 1 tablet (4 mg total) by mouth at bedtime., Disp: 30 tablet, Rfl: 0  Social History   Tobacco Use  Smoking Status Some Days   Types: Cigars  Smokeless Tobacco Never    Allergies  Allergen Reactions   Other     Oak trees, dogs, and cats   Shellfish Allergy    Objective:  There were no vitals filed for this visit. There is no height or weight on file to calculate BMI. Constitutional Well developed. Well nourished.  Vascular Dorsalis pedis pulses palpable bilaterally. Posterior tibial pulses palpable bilaterally. Capillary refill normal to all digits.  No cyanosis or clubbing noted. Pedal hair growth normal.  Neurologic Normal speech. Oriented to person, place, and time. Epicritic sensation to light touch grossly present bilaterally.  Dermatologic Bilateral skin epidermolysis noted with subjective complaint of itching.  Mild erythema noted.  These findings consistent with superinfection.  Orthopedic: Normal joint ROM without pain or crepitus bilaterally. No visible deformities. No bony tenderness.   Radiographs: None Assessment:  No diagnosis found. Plan:  Patient was evaluated and treated and all questions answered.  Bilateral foot superinfection/athlete's foot -All questions and concerns were discussed with the patient in extensive detail. -Given the amount of infection present she would benefit from doxycycline for 14 days Lamisil for 30 days and  Lotrisone cream to be applied twice a day.  She denies any liver issues. -If there is no improvement we will discuss other aggressive treatment options. -Also discussed shoe gear modification and prevention techniques  No follow-ups on file.

## 2023-03-23 ENCOUNTER — Ambulatory Visit: Payer: Medicaid Other | Admitting: Podiatry
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
## 2023-04-20 ENCOUNTER — Encounter: Payer: Self-pay | Admitting: Obstetrics and Gynecology

## 2023-04-20 ENCOUNTER — Ambulatory Visit: Payer: Medicaid Other | Admitting: Obstetrics and Gynecology

## 2023-04-20 ENCOUNTER — Other Ambulatory Visit (HOSPITAL_COMMUNITY)
Admission: RE | Admit: 2023-04-20 | Discharge: 2023-04-20 | Disposition: A | Discharge status: Home / Self Care | Payer: Medicaid Other | Source: Ambulatory Visit | Attending: Obstetrics and Gynecology | Admitting: Obstetrics and Gynecology

## 2023-04-20 VITALS — BP 109/72 | HR 67 | Ht 59.0 in | Wt 219.0 lb

## 2023-04-20 DIAGNOSIS — Z1339 Encounter for screening examination for other mental health and behavioral disorders: Secondary | ICD-10-CM | POA: Diagnosis not present

## 2023-04-20 DIAGNOSIS — Z01419 Encounter for gynecological examination (general) (routine) without abnormal findings: Secondary | ICD-10-CM | POA: Insufficient documentation

## 2023-04-20 DIAGNOSIS — N941 Unspecified dyspareunia: Secondary | ICD-10-CM

## 2023-04-20 NOTE — Progress Notes (Signed)
37 y.o. New GYN presents for AEX/PAP.  C/o hot flashes, night sweats, lower libido, painful sex, sore nipples, mood swings x 3 years.
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
## 2023-04-21 LAB — COMPREHENSIVE METABOLIC PANEL
ALT: 16 [IU]/L (ref 0–32)
AST: 14 [IU]/L (ref 0–40)
Albumin: 3.8 g/dL — ABNORMAL LOW (ref 3.9–4.9)
Alkaline Phosphatase: 55 [IU]/L (ref 44–121)
BUN/Creatinine Ratio: 16 (ref 9–23)
BUN: 14 mg/dL (ref 6–20)
Bilirubin Total: <0.2 mg/dL (ref 0.0–1.2)
CO2: 24 mmol/L (ref 20–29)
Calcium: 9 mg/dL (ref 8.7–10.2)
Chloride: 101 mmol/L (ref 96–106)
Creatinine, Ser: 0.85 mg/dL (ref 0.57–1.00)
Globulin, Total: 3 g/dL (ref 1.5–4.5)
Glucose: 79 mg/dL (ref 70–99)
Potassium: 4 mmol/L (ref 3.5–5.2)
Sodium: 138 mmol/L (ref 134–144)
Total Protein: 6.8 g/dL (ref 6.0–8.5)
eGFR: 89 mL/min/{1.73_m2} (ref >59)

## 2023-04-21 LAB — LIPID PANEL
Chol/HDL Ratio: 2.6 ratio (ref 0.0–4.4)
Cholesterol, Total: 151 mg/dL (ref 100–199)
HDL: 59 mg/dL (ref >39)
LDL Chol Calc (NIH): 72 mg/dL (ref 0–99)
Triglycerides: 115 mg/dL (ref 0–149)
VLDL Cholesterol Cal: 20 mg/dL (ref 5–40)

## 2023-04-21 LAB — CBC
Hematocrit: 36.3 % (ref 34.0–46.6)
Hemoglobin: 11.6 g/dL (ref 11.1–15.9)
MCH: 29.3 pg (ref 26.6–33.0)
MCHC: 32 g/dL (ref 31.5–35.7)
MCV: 92 fL (ref 79–97)
Platelets: 432 10*3/uL (ref 150–450)
RBC: 3.96 x10E6/uL (ref 3.77–5.28)
RDW: 13.1 % (ref 11.7–15.4)
WBC: 8.2 10*3/uL (ref 3.4–10.8)

## 2023-04-21 LAB — HEMOGLOBIN A1C
Est. average glucose Bld gHb Est-mCnc: 108 mg/dL
Hgb A1c MFr Bld: 5.4 % (ref 4.8–5.6)

## 2023-04-21 LAB — TSH: TSH: 1.77 u[IU]/mL (ref 0.450–4.500)

## 2023-04-25 LAB — CERVICOVAGINAL ANCILLARY ONLY
Bacterial Vaginitis (gardnerella): POSITIVE — AB
Candida Glabrata: NEGATIVE
Candida Vaginitis: NEGATIVE
Comment: NEGATIVE
Comment: NEGATIVE
Comment: NEGATIVE

## 2023-04-25 MED ORDER — METRONIDAZOLE 500 MG PO TABS: 500.0000 mg | ORAL_TABLET | Freq: Two times a day (BID) | ORAL | 0 refills | Status: AC

## 2023-04-25 NOTE — Addendum Note (Signed)
Addended by: Catalina Antigua on: 04/25/2023 11:06 AM   Modules accepted: Orders

## 2023-04-26 LAB — CYTOLOGY - PAP
Comment: NEGATIVE
Diagnosis: NEGATIVE
High risk HPV: NEGATIVE

## 2023-05-23 ENCOUNTER — Ambulatory Visit: Payer: Medicaid Other | Attending: Obstetrics and Gynecology
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
## 2023-07-20 ENCOUNTER — Ambulatory Visit: Payer: Medicaid Other

## 2023-07-27 ENCOUNTER — Ambulatory Visit: Payer: Medicaid Other

## 2023-07-29 ENCOUNTER — Ambulatory Visit: Payer: Medicaid Other | Admitting: Podiatry

## 2023-09-21 ENCOUNTER — Ambulatory Visit: Payer: Medicaid Other | Admitting: Physical Therapy

## 2023-09-28 ENCOUNTER — Ambulatory Visit: Payer: Medicaid Other | Attending: Obstetrics and Gynecology | Admitting: Physical Therapy

## 2023-09-28 ENCOUNTER — Other Ambulatory Visit: Payer: Self-pay

## 2023-09-28 DIAGNOSIS — R279 Unspecified lack of coordination: Secondary | ICD-10-CM | POA: Diagnosis present

## 2023-09-28 DIAGNOSIS — M6281 Muscle weakness (generalized): Secondary | ICD-10-CM | POA: Insufficient documentation

## 2023-09-28 DIAGNOSIS — R293 Abnormal posture: Secondary | ICD-10-CM | POA: Diagnosis present

## 2023-09-28 DIAGNOSIS — Z01419 Encounter for gynecological examination (general) (routine) without abnormal findings: Secondary | ICD-10-CM | POA: Diagnosis present

## 2023-09-28 DIAGNOSIS — M62838 Other muscle spasm: Secondary | ICD-10-CM | POA: Diagnosis present

## 2023-09-28 DIAGNOSIS — N941 Unspecified dyspareunia: Secondary | ICD-10-CM | POA: Diagnosis present
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
## 2023-10-05 ENCOUNTER — Ambulatory Visit: Payer: Medicaid Other | Admitting: Physical Therapy

## 2023-10-12 ENCOUNTER — Ambulatory Visit: Payer: Medicaid Other | Admitting: Physical Therapy

## 2023-10-12 DIAGNOSIS — M62838 Other muscle spasm: Secondary | ICD-10-CM

## 2023-10-12 DIAGNOSIS — M6281 Muscle weakness (generalized): Secondary | ICD-10-CM

## 2023-10-12 DIAGNOSIS — R279 Unspecified lack of coordination: Secondary | ICD-10-CM
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
## 2023-10-14 DIAGNOSIS — M7918 Myalgia, other site: Secondary | ICD-10-CM | POA: Insufficient documentation

## 2023-10-14 DIAGNOSIS — M5416 Radiculopathy, lumbar region: Secondary | ICD-10-CM | POA: Insufficient documentation

## 2023-10-14 DIAGNOSIS — M542 Cervicalgia: Secondary | ICD-10-CM | POA: Insufficient documentation

## 2023-11-22 DIAGNOSIS — S93401A Sprain of unspecified ligament of right ankle, initial encounter: Secondary | ICD-10-CM | POA: Insufficient documentation

## 2023-11-28 ENCOUNTER — Ambulatory Visit: Attending: Obstetrics and Gynecology | Admitting: Physical Therapy

## 2023-11-28 DIAGNOSIS — M6281 Muscle weakness (generalized): Secondary | ICD-10-CM | POA: Insufficient documentation

## 2023-11-28 DIAGNOSIS — Z01419 Encounter for gynecological examination (general) (routine) without abnormal findings: Secondary | ICD-10-CM | POA: Insufficient documentation

## 2023-11-28 DIAGNOSIS — R293 Abnormal posture: Secondary | ICD-10-CM | POA: Insufficient documentation

## 2023-11-28 DIAGNOSIS — M62838 Other muscle spasm: Secondary | ICD-10-CM | POA: Insufficient documentation

## 2023-11-28 DIAGNOSIS — R279 Unspecified lack of coordination: Secondary | ICD-10-CM | POA: Insufficient documentation

## 2023-11-28 DIAGNOSIS — N941 Unspecified dyspareunia: Secondary | ICD-10-CM | POA: Insufficient documentation

## 2023-12-05 ENCOUNTER — Telehealth: Payer: Self-pay | Admitting: Physical Therapy

## 2023-12-05 ENCOUNTER — Ambulatory Visit: Admitting: Physical Therapy

## 2023-12-05 NOTE — Telephone Encounter (Signed)
 PT called pt about this morning's appointment at 1615. Pt did not answer, voicemail left.    Avie Lemme, PT, DPT 06/09/255:03 PM

## 2023-12-12 ENCOUNTER — Ambulatory Visit: Admitting: Physical Therapy

## 2023-12-26 ENCOUNTER — Telehealth: Payer: Self-pay | Admitting: Physical Therapy

## 2023-12-26 ENCOUNTER — Ambulatory Visit: Admitting: Physical Therapy

## 2023-12-26 NOTE — Telephone Encounter (Signed)
 PT called pt about today's appointment at 1615. Call unable to connect, 2 attempts made. Unfortunately due to clinic's attendance policy pt will be discharged from PT.   Darryle Navy, PT, DPT 06/30/254:45 PM

## 2024-01-09 ENCOUNTER — Emergency Department (HOSPITAL_COMMUNITY)
Admission: EM | Admit: 2024-01-09 | Discharge: 2024-01-09 | Disposition: A | Discharge status: Home / Self Care | Attending: Emergency Medicine | Admitting: Emergency Medicine

## 2024-01-09 ENCOUNTER — Emergency Department (HOSPITAL_COMMUNITY)

## 2024-01-09 ENCOUNTER — Other Ambulatory Visit: Payer: Self-pay

## 2024-01-09 ENCOUNTER — Encounter (HOSPITAL_COMMUNITY): Payer: Self-pay | Admitting: *Deleted

## 2024-01-09 DIAGNOSIS — R350 Frequency of micturition: Secondary | ICD-10-CM | POA: Diagnosis not present

## 2024-01-09 DIAGNOSIS — R109 Unspecified abdominal pain: Secondary | ICD-10-CM | POA: Diagnosis present

## 2024-01-09 LAB — PREGNANCY, URINE: Preg Test, Ur: NEGATIVE

## 2024-01-09 LAB — CBC WITH DIFFERENTIAL/PLATELET
Abs Immature Granulocytes: 0.03 K/uL (ref 0.00–0.07)
Basophils Absolute: 0 K/uL (ref 0.0–0.1)
Basophils Relative: 0 %
Eosinophils Absolute: 0.1 K/uL (ref 0.0–0.5)
Eosinophils Relative: 1 %
HCT: 36.5 % (ref 36.0–46.0)
Hemoglobin: 11.8 g/dL — ABNORMAL LOW (ref 12.0–15.0)
Immature Granulocytes: 0 %
Lymphocytes Relative: 28 %
Lymphs Abs: 2.2 K/uL (ref 0.7–4.0)
MCH: 29.4 pg (ref 26.0–34.0)
MCHC: 32.3 g/dL (ref 30.0–36.0)
MCV: 90.8 fL (ref 80.0–100.0)
Monocytes Absolute: 0.7 K/uL (ref 0.1–1.0)
Monocytes Relative: 9 %
Neutro Abs: 4.9 K/uL (ref 1.7–7.7)
Neutrophils Relative %: 62 %
Platelets: 411 K/uL — ABNORMAL HIGH (ref 150–400)
RBC: 4.02 MIL/uL (ref 3.87–5.11)
RDW: 14.1 % (ref 11.5–15.5)
WBC: 7.9 K/uL (ref 4.0–10.5)
nRBC: 0.4 % — ABNORMAL HIGH (ref 0.0–0.2)

## 2024-01-09 LAB — COMPREHENSIVE METABOLIC PANEL WITH GFR
ALT: 17 U/L (ref 0–44)
AST: 14 U/L — ABNORMAL LOW (ref 15–41)
Albumin: 3.7 g/dL (ref 3.5–5.0)
Alkaline Phosphatase: 52 U/L (ref 38–126)
Anion gap: 9 (ref 5–15)
BUN: 13 mg/dL (ref 6–20)
CO2: 27 mmol/L (ref 22–32)
Calcium: 9.2 mg/dL (ref 8.9–10.3)
Chloride: 103 mmol/L (ref 98–111)
Creatinine, Ser: 0.65 mg/dL (ref 0.44–1.00)
GFR, Estimated: >60 mL/min (ref >60)
Glucose, Bld: 100 mg/dL — ABNORMAL HIGH (ref 70–99)
Potassium: 4 mmol/L (ref 3.5–5.1)
Sodium: 139 mmol/L (ref 135–145)
Total Bilirubin: 0.4 mg/dL (ref 0.0–1.2)
Total Protein: 7.4 g/dL (ref 6.5–8.1)

## 2024-01-09 LAB — URINALYSIS, ROUTINE W REFLEX MICROSCOPIC
Bilirubin Urine: NEGATIVE
Glucose, UA: NEGATIVE mg/dL
Hgb urine dipstick: NEGATIVE
Ketones, ur: NEGATIVE mg/dL
Leukocytes,Ua: NEGATIVE
Nitrite: NEGATIVE
Protein, ur: NEGATIVE mg/dL
Specific Gravity, Urine: 1.016 (ref 1.005–1.030)
pH: 7 (ref 5.0–8.0)

## 2024-01-09 MED ORDER — IOHEXOL 300 MG/ML  SOLN
100.0000 mL | Freq: Once | INTRAMUSCULAR | Status: AC | PRN
  Administered 2024-01-09: 100 mL via INTRAVENOUS

## 2024-01-09 MED ORDER — DIPHENHYDRAMINE HCL 50 MG/ML IJ SOLN
50.0000 mg | Freq: Once | INTRAMUSCULAR | Status: AC
  Administered 2024-01-09: 50 mg via INTRAVENOUS
  Filled 2024-01-09: qty 1

## 2024-01-09 MED ORDER — KETOROLAC TROMETHAMINE 15 MG/ML IJ SOLN
15.0000 mg | Freq: Once | INTRAMUSCULAR | Status: AC
  Administered 2024-01-09: 15 mg via INTRAVENOUS
  Filled 2024-01-09: qty 1

## 2024-01-09 MED ORDER — ACETAMINOPHEN 325 MG PO TABS
650.0000 mg | ORAL_TABLET | Freq: Once | ORAL | Status: AC
  Administered 2024-01-09: 650 mg via ORAL
  Filled 2024-01-09: qty 2

## 2024-01-09 NOTE — ED Provider Notes (Cosign Needed Addendum)
 [place] EMERGENCY DEPARTMENT AT [facility] Provider Note   CSN: [id number] Arrival date & time: 01/09/24  1002     Patient presents with: Flank Pain   Tina Malone is a 38 y.o. female.    Flank Pain  Patient is a 38 year old female seen in the ED today with concerns for right flank pain that radiates to her right side, that she noticed has been intermittent x 2 weeks after she first noticed it in the shower when she was bending down.  Reports that there appears to time that the pain goes away but then returns, feeling like a knee grinding into my right side.  Also noting that she has had a reported urinary frequency stating that she has been drinking around a lot of water recently but with no dysuria, no vaginal discharge, no vaginal bleeding.  Denies headache, vision changes, chest pain, shortness of breath, cough, congestion, nausea, vomiting, diarrhea, hematemesis, hemoptysis, hematuria, hematochezia, melena, lower leg swelling.     Prior to Admission medications   Medication Sig Start Date End Date Taking? Authorizing Provider  cephALEXin  (KEFLEX ) 500 MG capsule 2 caps po bid x 7 days Patient not taking: Reported on 09/28/2023 04/27/22   Harris, Abigail, PA-C  clotrimazole -betamethasone  (LOTRISONE ) cream Apply 1 Application topically daily. Patient not taking: Reported on 09/28/2023 02/02/23   Tobie Franky SQUIBB, DPM  cyclobenzaprine  (FLEXERIL ) 10 MG tablet Take 1 tablet (10 mg total) by mouth 2 (two) times daily as needed for muscle spasms. Patient not taking: Reported on 09/28/2023 01/26/23   Silver Fell A, PA  doxycycline  (VIBRA -TABS) 100 MG tablet Take 1 tablet (100 mg total) by mouth 2 (two) times daily. Patient not taking: Reported on 09/28/2023 05/06/22   Tobie Franky SQUIBB, DPM  doxycycline  (VIBRA -TABS) 100 MG tablet Take 1 tablet (100 mg total) by mouth 2 (two) times daily. Patient not taking: Reported on 09/28/2023 02/02/23   Tobie Franky SQUIBB, DPM  ketoconazole  (NIZORAL )  2 % cream Apply 1 Application topically daily. Patient not taking: Reported on 09/28/2023 04/27/22   Harris, Abigail, PA-C  lidocaine  (LIDODERM ) 5 % Place 1 patch onto the skin daily. Remove & Discard patch within 12 hours or as directed by MD Patient not taking: Reported on 09/28/2023 01/26/23   Silver Fell A, PA  metroNIDAZOLE  (FLAGYL ) 500 MG tablet Take 1 tablet (500 mg total) by mouth 2 (two) times daily. Patient not taking: Reported on 09/28/2023 04/25/23   Constant, Peggy, MD  mupirocin  ointment (BACTROBAN ) 2 % Apply 1 Application topically daily. Patient not taking: Reported on 09/28/2023 04/27/22   Harris, Abigail, PA-C  pregabalin  (LYRICA ) 75 MG capsule Take 1 capsule (75 mg total) by mouth 2 (two) times daily. Patient not taking: Reported on 09/28/2023 05/06/22   Tobie Franky SQUIBB, DPM  sertraline (ZOLOFT) 50 MG tablet Take 50 mg by mouth daily. Patient not taking: Reported on 09/28/2023 03/28/23   [provider]  terbinafine  (LAMISIL ) 250 MG tablet Take 1 tablet (250 mg total) by mouth daily. Patient not taking: Reported on 09/28/2023 05/06/22   Tobie Franky SQUIBB, DPM  terbinafine  (LAMISIL ) 250 MG tablet Take 1 tablet (250 mg total) by mouth daily. Patient not taking: Reported on 09/28/2023 02/02/23   Tobie Franky SQUIBB, DPM  tiZANidine  (ZANAFLEX ) 4 MG tablet Take 1 tablet (4 mg total) by mouth at bedtime. Patient not taking: Reported on 09/28/2023 11/17/21   Christopher Savannah, PA-C    Allergies: Other, Shellfish allergy, and Ivp dye [iodinated contrast media]  Review of Systems  Genitourinary:  Positive for flank pain.  All other systems reviewed and are negative.   Updated Vital Signs BP 132/88 (BP Location: Left Arm)   Pulse 86   Temp 100.2 F (37.9 C) (Oral)   Resp 16   Ht 4' 11 (1.499 m)   Wt 106.6 kg   LMP 01/02/2024 (Exact Date)   SpO2 100%   BMI 47.46 kg/m   Physical Exam Vitals and nursing note reviewed.  Constitutional:      General: She is not in acute distress.     Appearance: Normal appearance. She is not ill-appearing or diaphoretic.  HENT:     Head: Normocephalic and atraumatic.  Eyes:     General: No scleral icterus.       Right eye: No discharge.        Left eye: No discharge.     Extraocular Movements: Extraocular movements intact.     Conjunctiva/sclera: Conjunctivae normal.  Cardiovascular:     Rate and Rhythm: Normal rate and regular rhythm.     Pulses: Normal pulses.     Heart sounds: Normal heart sounds. No murmur heard.    No friction rub. No gallop.  Pulmonary:     Effort: Pulmonary effort is normal. No respiratory distress.     Breath sounds: Normal breath sounds. No stridor. No wheezing, rhonchi or rales.  Chest:     Chest wall: No tenderness.  Abdominal:     General: Abdomen is flat. There is no distension.     Palpations: Abdomen is soft.     Tenderness: There is no abdominal tenderness. There is right CVA tenderness. There is no left CVA tenderness, guarding or rebound.  Musculoskeletal:        General: No swelling, tenderness, deformity or signs of injury.     Cervical back: Normal range of motion and neck supple. No rigidity or tenderness.     Right lower leg: No edema.     Left lower leg: No edema.  Skin:    General: Skin is warm and dry.     Findings: No bruising, erythema, lesion or rash.  Neurological:     General: No focal deficit present.     Mental Status: She is alert and oriented to person, place, and time. Mental status is at baseline.     Sensory: No sensory deficit.     Motor: No weakness.     Coordination: Coordination normal.     Gait: Gait normal.  Psychiatric:        Mood and Affect: Mood normal.     (all labs ordered are listed, but only abnormal results are displayed) Labs Reviewed  CBC WITH DIFFERENTIAL/PLATELET - Abnormal; Notable for the following components:      Result Value   Hemoglobin 11.8 (*)    Platelets 411 (*)    nRBC 0.4 (*)    All other components within normal limits   COMPREHENSIVE METABOLIC PANEL WITH GFR - Abnormal; Notable for the following components:   Glucose, Bld 100 (*)    AST 14 (*)    All other components within normal limits  URINALYSIS, ROUTINE W REFLEX MICROSCOPIC  PREGNANCY, URINE  LIPASE, BLOOD    EKG: None  Radiology: CT ABDOMEN PELVIS W CONTRAST Result Date: 01/09/2024 CLINICAL DATA:  Right flank pain, fever EXAM: CT ABDOMEN AND PELVIS WITH CONTRAST TECHNIQUE: Multidetector CT imaging of the abdomen and pelvis was performed using the standard protocol following bolus administration of intravenous contrast.  RADIATION DOSE REDUCTION: This exam was performed according to the departmental dose-optimization program which includes automated exposure control, adjustment of the mA and/or kV according to patient size and/or use of iterative reconstruction technique. CONTRAST:  OMNIPAQUE  IOHEXOL  300 MG/ML  SOLN COMPARISON:  Nov 17, 2021 lumbar spine radiographs FINDINGS: lower chest: Unremarkable. Hepatobiliary: No focal liver lesions.  Status post cholecystectomy. Pancreas: Unremarkable. Spleen: Unremarkable. Adrenals/Urinary Tract: Adrenal glands are unremarkable. No nephrolithiasis or hydronephrosis. Stomach/Bowel: No evidence of bowel obstruction or inflammation. Appendix is unremarkable. Vascular/Lymphatic: Normal caliber aorta. No lymphadenopathy by size criteria. Reproductive: 4.0 cm right ovarian cyst. Uterus is unremarkable. No left adnexal lesions. Other: No free air or free fluid. Musculoskeletal: No acute osseous findings. IMPRESSION: 4.0 cm simple appearing right ovarian cyst. No follow-up imaging recommended. Note: This recommendation does not apply to premenarchal patients and to those with increased risk (genetic, family history, elevated tumor markers or other high-risk factors) of ovarian cancer. Reference: JACR 2020 Feb; 17(2):248-254 Electronically Signed   By: Michaeline Blanch M.D.   On: 01/09/2024 13:50    Procedures    Medications Ordered in the ED  acetaminophen  (TYLENOL ) tablet 650 mg (650 mg Oral Given 01/09/24 1032)  iohexol  (OMNIPAQUE ) 300 MG/ML solution 100 mL (100 mLs Intravenous Contrast Given 01/09/24 1327)  diphenhydrAMINE  (BENADRYL ) injection 50 mg (50 mg Intravenous Given 01/09/24 1355)  ketorolac  (TORADOL ) 15 MG/ML injection 15 mg (15 mg Intravenous Given 01/09/24 1522)                               Medical Decision Making Amount and/or Complexity of Data Reviewed Labs: ordered. Radiology: ordered.  Risk OTC drugs. Prescription drug management.   This patient is a 38 year old female who presents to the ED for concern of right flank pain that has been intermittent for the last 2 weeks, first noticing while she was in the shower.  Notes that this has been worse with twisting movements and described as knee going into my side.  Denies any infectious symptoms.  However was noted to be febrile in triage today.  Has not been taking Tylenol  and ibuprofen.  On physical exam, patient is in no acute distress, afebrile, alert and orient x 4, speaking in full sentences, nontachypneic, nontachycardic.  Notably tender to the right flank and extending to the axillary line, however no abdominal tenderness and the 4 quadrants.  No rash, erythema, ecchymosis noted.  Skin is not warm and no mass or swelling noted.  Unremarkable exam otherwise.  Lab work imaging were unremarkable for any new findings.  CT abdomen pelvis does not show any acute findings however does note a 4 cm cyst in the right side on ovary.  With current findings, provided her Toradol  and symptoms have improved.  However will have her continue to follow-up with PCP as well as OB/GYN for patient symptoms and return to the ED for any new or worsening symptoms.  Patient is wishing to go home at this time.  Will have her continue to treat symptomatically at home.  Case was discussed with attending who agreed with plan.  Patient vital signs have  remained stable throughout the course of patient's time in the ED. Low suspicion for any other emergent pathology at this time. I believe this patient is safe to be discharged. Provided strict return to ER precautions. Patient expressed agreement and understanding of plan. All questions were answered.  Differential diagnoses prior to evaluation: The  emergent differential diagnosis includes, but is not limited to, cholecystitis, pancreatitis, choledocholithiasis, hepatitis, cellulitis, nephrolithiasis, muscle strain, gastritis, gastroenteritis,. This is not an exhaustive differential.   Past Medical History / Co-morbidities / Social History: Asthma, anemia, anxiety, depression Status post cholecystectomy  Additional history: Chart reviewed. Pertinent results include: Notably seen for myofascial pain syndrome on 12/22/2023.  Lab Tests/Imaging studies: I personally interpreted labs/imaging and the pertinent results include:    CBC notes a mild anemia with hemoglobin 11.8.Similar to previous 8 months ago  CMP unremarkable UA unremarkable Urine pregnancy negative Lipase negative CT abdomen Shows 4 cm simple ovarian right cyst with no other acute findings. I agree with the radiologist interpretation.   Medications: I ordered medication including Benadryl , Tylenol .  I have reviewed the patients home medicines and have made adjustments as needed.  Critical Interventions: None  Social Determinants of Health: Notably is good follow-up with PCP  Disposition: After consideration of the diagnostic results and the patients response to treatment, I feel that the patient would benefit from discharge and treatment as above.   emergency department workup does not suggest an emergent condition requiring admission or immediate intervention beyond what has been performed at this time. The plan is: Follow-up with PCP, return to the ED for any new or worsening symptoms. The patient is safe for discharge  and has been instructed to return immediately for worsening symptoms, change in symptoms or any other concerns.  Final diagnoses:  Flank pain    ED Discharge Orders     None          Beola Terrall GORMAN DEVONNA 01/09/24 1536    Beola Terrall GORMAN, [place] 01/09/24 1537    Laurice Maude BROCKS, MD 01/10/24 [id number]

## 2024-01-09 NOTE — ED Notes (Signed)
 Patient transported to CT

## 2024-01-09 NOTE — ED Notes (Signed)
 Lab notified of upreg pending, urine in lab.

## 2024-01-09 NOTE — ED Triage Notes (Signed)
 Here by POV from home for R flank pain, constant over last 3 weeks, also endorses urinary frequency. Rates pain 10/10. Denies other sx. No relief with tylenol , ibuprofen, or aleve.

## 2024-01-09 NOTE — ED Notes (Signed)
 Pt received contrast in CT and started itching. EDP assessed, ordered benadryl . Given in CT. Pt transferred to hall bed for observation. Itching beginning to subside. Airway patent, no lip tingling

## 2024-01-09 NOTE — Discharge Instructions (Addendum)
 You were seen today for right flank pain.  Your lab work imaging today and physical exam today were reassuring that have low suspicion for any emergent cause of your symptoms today.  However recommend continued follow-up with PCP and utilize Tylenol  and ibuprofen as for the pain relief.  You can also use over-the-counter topical medication such as Salonpas pads and heating pads to help additional relief.  Take Tylenol  (acetominophen)  650mg  every 4-6 hours, as needed for pain or fever. Do not take more than 4,000 mg in a 24-hour period. As this may cause liver damage. While this is rare, if you begin to develop yellowing of the skin or eyes, stop taking and return to ER immediately.  Take Ibuprofen 400mg  every 4-6 hours for pain or fever, not exceeding 3,200 mg per day as more than 3,200mg  can cause Stomach irritation, dizziness, kidney issues with long-term use.  You were noted also to be febrile today.  Suspect this is likely due to a viral syndrome at this point with reassuring labs and imaging however recommend to continue to monitor symptoms and return to the ED did not have any new or worsening symptoms which would include persistent vomiting, painful urination, blood in urine, blood in stool, shortness of breath, chest pain.

## 2024-03-12 ENCOUNTER — Other Ambulatory Visit: Payer: Self-pay

## 2024-03-12 ENCOUNTER — Emergency Department (HOSPITAL_COMMUNITY)
Admission: EM | Admit: 2024-03-12 | Discharge: 2024-03-12 | Disposition: A | Discharge status: Home / Self Care | Attending: Emergency Medicine | Admitting: Emergency Medicine

## 2024-03-12 ENCOUNTER — Encounter (HOSPITAL_COMMUNITY): Payer: Self-pay

## 2024-03-12 ENCOUNTER — Emergency Department (HOSPITAL_COMMUNITY)

## 2024-03-12 DIAGNOSIS — M67921 Unspecified disorder of synovium and tendon, right upper arm: Secondary | ICD-10-CM

## 2024-03-12 DIAGNOSIS — M67823 Other specified disorders of tendon, right elbow: Secondary | ICD-10-CM | POA: Insufficient documentation

## 2024-03-12 DIAGNOSIS — M25521 Pain in right elbow: Secondary | ICD-10-CM | POA: Diagnosis present

## 2024-03-12 DIAGNOSIS — J45909 Unspecified asthma, uncomplicated: Secondary | ICD-10-CM | POA: Diagnosis not present

## 2024-03-12 MED ORDER — KETOROLAC TROMETHAMINE 30 MG/ML IJ SOLN
30.0000 mg | Freq: Once | INTRAMUSCULAR | Status: AC
  Administered 2024-03-12: 30 mg via INTRAMUSCULAR
  Filled 2024-03-12: qty 1

## 2024-03-12 MED ORDER — PREDNISONE 20 MG PO TABS: 40.0000 mg | ORAL_TABLET | Freq: Every day | ORAL | 0 refills | Status: AC

## 2024-03-12 MED ORDER — OXYCODONE-ACETAMINOPHEN 5-325 MG PO TABS
1.0000 | ORAL_TABLET | Freq: Once | ORAL | Status: AC
  Administered 2024-03-12: 1 via ORAL
  Filled 2024-03-12: qty 1

## 2024-03-12 MED ORDER — OXYCODONE-ACETAMINOPHEN 5-325 MG PO TABS: 1.0000 | ORAL_TABLET | Freq: Four times a day (QID) | ORAL | 0 refills | Status: AC | PRN

## 2024-03-12 MED ORDER — PREDNISONE 20 MG PO TABS
60.0000 mg | ORAL_TABLET | Freq: Once | ORAL | Status: AC
  Administered 2024-03-12: 60 mg via ORAL
  Filled 2024-03-12: qty 3

## 2024-03-12 NOTE — Discharge Instructions (Signed)
 You can try ice and heat over the elbow as well and gentle range of motion exercises.  You can continue to take the Aleve or Advil but also can use the other medication as needed for pain.  You will do a short course of steroids to see if that helps with the inflammation.  You need to avoid any heavy lifting.  Your x-ray look good.

## 2024-03-12 NOTE — ED Triage Notes (Signed)
 Pt presents to ED from home C/O R arm pain beginning at elbow and down to fingers. 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 Endorses some numbness and tingling in R arm also. Denies injury. Reports feels similar to previously treated tendonitis of R elbow. Also would like R eye pain/stye evaluated.

## 2024-03-12 NOTE — ED Provider Notes (Signed)
 Tina Malone Provider Note   CSN: [id number] Arrival date & time: 03/12/24  1025     Patient presents with: Arm Pain   Tina Malone is a 38 y.o. female.   Patient is a 38 year old female with a history of asthma, chronic low back pain, various other joint and muscle pains that she has not gotten further workup on who is presenting today with complaint of severe pain in her right elbow that radiates down into her arm.  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  The pain is worse when she tries to rotate her hand but she denies any wrist pain at all seems to be in the forearm and the elbow.  She does have some pain that radiates up into the shoulder and neck but reports that it is similar to stuff she has had in the past.  She does have a history of tendinopathy in her elbow and was getting physical therapy but reports this pain is significantly worse than anything she has experienced prior.  She tried taking Aleve and ibuprofen with no improvement.  She reports that she has a Malone pain.  She does not feel like she is having muscle spasms.  She has not noticed any swelling in the arm.  She denies any known trauma.  No recent procedures done on that side of her body.  The history is provided by the patient.  Arm Pain       Prior to Admission medications   Medication Sig Start Date End Date Taking? Authorizing Provider  oxyCODONE -acetaminophen  (PERCOCET/ROXICET) 5-325 MG tablet Take 1 tablet by mouth every 6 (six) hours as needed for severe pain (pain score 7-10). 03/12/24  Yes Doretha Folks, MD  predniSONE  (DELTASONE ) 20 MG tablet Take 2 tablets (40 mg total) by mouth daily. 03/12/24  Yes Doretha Folks, MD  cephALEXin  (KEFLEX ) 500 MG capsule 2 caps po bid x 7 days Patient not taking: Reported on 09/28/2023 04/27/22   Malone, Abigail, PA-C  clotrimazole -betamethasone  (LOTRISONE ) cream Apply 1  Application topically daily. Patient not taking: Reported on 09/28/2023 02/02/23   Tobie Franky SQUIBB, DPM  cyclobenzaprine  (FLEXERIL ) 10 MG tablet Take 1 tablet (10 mg total) by mouth 2 (two) times daily as needed for muscle spasms. Patient not taking: Reported on 09/28/2023 01/26/23   Silver Wonda LABOR, PA  doxycycline  (VIBRA -TABS) 100 MG tablet Take 1 tablet (100 mg total) by mouth 2 (two) times daily. Patient not taking: Reported on 09/28/2023 05/06/22   Tobie Franky SQUIBB, DPM  doxycycline  (VIBRA -TABS) 100 MG tablet Take 1 tablet (100 mg total) by mouth 2 (two) times daily. Patient not taking: Reported on 09/28/2023 02/02/23   Tobie Franky SQUIBB, DPM  ketoconazole  (NIZORAL ) 2 % cream Apply 1 Application topically daily. Patient not taking: Reported on 09/28/2023 04/27/22   Malone, Abigail, PA-C  lidocaine  (LIDODERM ) 5 % Place 1 patch onto the skin daily. Remove & Discard patch within 12 hours or as directed by MD Patient not taking: Reported on 09/28/2023 01/26/23   Silver Wonda A, PA  metroNIDAZOLE  (FLAGYL ) 500 MG tablet Take 1 tablet (500 mg total) by mouth 2 (two) times daily. Patient not taking: Reported on 09/28/2023 04/25/23   Malone, Peggy, MD  mupirocin  ointment (BACTROBAN ) 2 % Apply 1 Application topically daily. Patient not taking: Reported on 09/28/2023 04/27/22   Malone, Abigail, PA-C  pregabalin  (LYRICA ) 75 MG capsule Take 1 capsule (75 mg  total) by mouth 2 (two) times daily. Patient not taking: Reported on 09/28/2023 05/06/22   Tobie Franky SQUIBB, DPM  sertraline (ZOLOFT) 50 MG tablet Take 50 mg by mouth daily. Patient not taking: Reported on 09/28/2023 03/28/23   [provider]  terbinafine  (LAMISIL ) 250 MG tablet Take 1 tablet (250 mg total) by mouth daily. Patient not taking: Reported on 09/28/2023 05/06/22   Tobie Franky SQUIBB, DPM  terbinafine  (LAMISIL ) 250 MG tablet Take 1 tablet (250 mg total) by mouth daily. Patient not taking: Reported on 09/28/2023 02/02/23   Tobie Franky SQUIBB, DPM  tiZANidine  (ZANAFLEX ) 4  MG tablet Take 1 tablet (4 mg total) by mouth at bedtime. Patient not taking: Reported on 09/28/2023 11/17/21   Tina Savannah, PA-C    Allergies: Other, Shellfish allergy, and Ivp dye [iodinated contrast media]    Review of Systems  Updated Vital Signs BP (!) 145/92 (BP Location: Left Arm)   Pulse 69   Temp 97.9 F (36.6 C) (Oral)   Resp 17   SpO2 100%   Physical Exam Vitals and nursing note reviewed.  Constitutional:      General: She is not in acute distress.    Appearance: She is well-developed.  HENT:     Head: Normocephalic and atraumatic.  Eyes:     Pupils: Pupils are equal, round, and reactive to light.  Cardiovascular:     Rate and Rhythm: Normal rate.     Pulses: Normal pulses.  Pulmonary:     Effort: Pulmonary effort is normal. No respiratory distress.  Musculoskeletal:        General: Tenderness present.     Right elbow: Decreased range of motion. Tenderness present in medial epicondyle and lateral epicondyle.     Right wrist: Normal.     Right hand: Normal.     Comments: No edema.  Severe pain with palpation of the medial epicondyle.  Mild tenderness with palpation of the forearm but no evidence of rash, swelling.  Normal capillary refill in the fingers.  Normal grip strength but pain is reproduced by rotation of the wrist.  Skin:    General: Skin is warm and dry.     Findings: No rash.  Neurological:     Mental Status: She is alert and oriented to person, place, and time.     Cranial Nerves: No cranial nerve deficit.  Psychiatric:        Behavior: Behavior normal.     (all labs ordered are listed, but only abnormal results are displayed) Labs Reviewed - No data to display  EKG: None  Radiology: DG Elbow Complete Right Result Date: 03/12/2024 CLINICAL DATA:  Right arm pain beginning at the elbow and extending to the fingers. EXAM: RIGHT ELBOW - COMPLETE 3+ VIEW COMPARISON:  None Available. FINDINGS: There is no evidence of fracture, dislocation, or joint  effusion. There is no evidence of arthropathy or other focal bone abnormality. Soft tissues are unremarkable. IMPRESSION: Negative. Electronically Signed   By: Harrietta Sherry M.D.   On: 03/12/2024 11:26     Procedures   Medications Ordered in the ED  predniSONE  (DELTASONE ) tablet 60 mg (has no administration in time range)  oxyCODONE -acetaminophen  (PERCOCET/ROXICET) 5-325 MG per tablet 1 tablet (has no administration in time range)  ketorolac  (TORADOL ) 30 MG/ML injection 30 mg (30 mg Intramuscular Given 03/12/24 1106)  Medical Decision Making Amount and/or Complexity of Data Reviewed Radiology: ordered and independent interpretation performed. Decision-making details documented in ED Course.  Risk Prescription drug management.   Patient presenting with pain in her elbow today that radiates down her arm.  Concern for possible nerve impingement or tendinopathy that is exacerbated.  No evidence of cellulitis and low suspicion for septic joint.  Radial pulses intact and low suspicion for arterial ischemia or DVT. I have independently visualized and interpreted pt's images today.  Elbow image is neg.  Will treat for tendonopathy.  Will try an oral steroid and nsaids.  Pt will need f/u with her PT      Final diagnoses:  Tendinopathy of elbow, right    ED Discharge Orders          Ordered    oxyCODONE -acetaminophen  (PERCOCET/ROXICET) 5-325 MG tablet  Every 6 hours PRN        03/12/24 1150    predniSONE  (DELTASONE ) 20 MG tablet  Daily        03/12/24 1150               Doretha Folks, MD 03/12/24 1151

## 2024-03-16 DIAGNOSIS — M7710 Lateral epicondylitis, unspecified elbow: Secondary | ICD-10-CM | POA: Insufficient documentation

## 2024-03-16 DIAGNOSIS — K5900 Constipation, unspecified: Secondary | ICD-10-CM | POA: Insufficient documentation

## 2024-04-18 ENCOUNTER — Encounter: Admitting: Obstetrics and Gynecology

## 2024-07-17 ENCOUNTER — Encounter

## 2024-07-17 ENCOUNTER — Telehealth: Payer: Self-pay

## 2024-07-17 NOTE — Telephone Encounter (Signed)
 Telephone calls placed X2 for scheduled telephone, pre-visit appt.  Each call rang a few times and then went to a fast, busy signal. A no show letter will be sent to the patient.  If patient does not call back to schedule the pre-visit appointment before the end of the day today, her procedure will be canceled per policy.

## 2024-07-17 NOTE — Telephone Encounter (Signed)
 Patient called back and r/s'd PV for tomorrow, 07/18/24 in room 50. .sac

## 2024-07-18 ENCOUNTER — Ambulatory Visit

## 2024-07-18 VITALS — Ht 59.0 in | Wt 243.0 lb

## 2024-07-18 MED ORDER — PEG 3350-KCL-NA BICARB-NACL 420 G PO SOLR: 4000.0000 mL | Freq: Once | ORAL | 0 refills | Status: AC

## 2024-07-18 NOTE — Progress Notes (Signed)
 No egg or soy allergy known to patient  No issues known to pt with past sedation with any surgeries or procedures Patient denies ever being told they had issues or difficulty with intubation  No FH of Malignant Hyperthermia Pt is not on diet pills Pt is not on  home 02  Pt is not on blood thinners  Pt has issues with constipation; drinks water and adds fiber. No A fib or A flutter Have any cardiac testing pending--no Pt can ambulate independently Pt denies use of chewing tobacco Discussed diabetic I weight loss medication holds Discussed NSAID holds Checked BMI Pt instructed to use [employer] or GoodRx for a price reduction on prep  Patient's chart reviewed by Norleen Schillings CNRA prior to previsit and patient appropriate for the [facility].  Pre visit completed and red dot placed by patient's name on their procedure day (on provider's schedule).

## 2024-08-03 ENCOUNTER — Encounter: Admitting: Gastroenterology

## 2024-08-15 ENCOUNTER — Ambulatory Visit: Admitting: Obstetrics and Gynecology

## 2024-08-20 ENCOUNTER — Encounter: Admitting: Gastroenterology
